# Patient Record
Sex: Male | Born: 1980 | Race: Black or African American | Hispanic: No | Smoking: Current every day smoker
Health system: Southern US, Community
[De-identification: ages and names within clinical notes are randomized; demographics above are authoritative.]

## PROBLEM LIST (undated history)

## (undated) HISTORY — PX: HERNIA REPAIR: SHX51

---

## 2011-07-21 ENCOUNTER — Emergency Department: Payer: Self-pay | Admitting: Emergency Medicine

## 2011-09-07 ENCOUNTER — Emergency Department: Payer: Self-pay | Admitting: *Deleted

## 2020-05-30 ENCOUNTER — Encounter: Payer: Self-pay | Admitting: Emergency Medicine

## 2020-05-30 ENCOUNTER — Other Ambulatory Visit: Payer: Self-pay

## 2020-05-30 ENCOUNTER — Emergency Department
Admission: EM | Admit: 2020-05-30 | Discharge: 2020-05-31 | Disposition: A | Discharge status: Other Acute Inpatient Hospital | Payer: Self-pay | Attending: Student in an Organized Health Care Education/Training Program | Admitting: Student in an Organized Health Care Education/Training Program

## 2020-05-30 DIAGNOSIS — R109 Unspecified abdominal pain: Secondary | ICD-10-CM | POA: Insufficient documentation

## 2020-05-30 DIAGNOSIS — R519 Headache, unspecified: Secondary | ICD-10-CM | POA: Insufficient documentation

## 2020-05-30 DIAGNOSIS — F1590 Other stimulant use, unspecified, uncomplicated: Secondary | ICD-10-CM | POA: Insufficient documentation

## 2020-05-30 DIAGNOSIS — R45851 Suicidal ideations: Secondary | ICD-10-CM | POA: Insufficient documentation

## 2020-05-30 DIAGNOSIS — F17200 Nicotine dependence, unspecified, uncomplicated: Secondary | ICD-10-CM | POA: Insufficient documentation

## 2020-05-30 DIAGNOSIS — F4321 Adjustment disorder with depressed mood: Secondary | ICD-10-CM | POA: Insufficient documentation

## 2020-05-30 DIAGNOSIS — Z20822 Contact with and (suspected) exposure to covid-19: Secondary | ICD-10-CM | POA: Insufficient documentation

## 2020-05-30 LAB — URINALYSIS, COMPLETE (UACMP) WITH MICROSCOPIC
Bacteria, UA: NONE SEEN
Bilirubin Urine: NEGATIVE
Glucose, UA: NEGATIVE mg/dL
Hgb urine dipstick: NEGATIVE
Ketones, ur: 80 mg/dL — AB
Leukocytes,Ua: NEGATIVE
Nitrite: NEGATIVE
Protein, ur: 30 mg/dL — AB
Specific Gravity, Urine: 1.028 (ref 1.005–1.030)
Squamous Epithelial / HPF: NONE SEEN (ref 0–5)
pH: 5 (ref 5.0–8.0)

## 2020-05-30 LAB — COMPREHENSIVE METABOLIC PANEL
ALT: 17 U/L (ref 0–44)
AST: 19 U/L (ref 15–41)
Albumin: 4.6 g/dL (ref 3.5–5.0)
Alkaline Phosphatase: 50 U/L (ref 38–126)
Anion gap: 12 (ref 5–15)
BUN: 20 mg/dL (ref 6–20)
CO2: 25 mmol/L (ref 22–32)
Calcium: 9.1 mg/dL (ref 8.9–10.3)
Chloride: 100 mmol/L (ref 98–111)
Creatinine, Ser: 1.1 mg/dL (ref 0.61–1.24)
GFR calc Af Amer: >60 mL/min (ref >60)
GFR calc non Af Amer: >60 mL/min (ref >60)
Glucose, Bld: 81 mg/dL (ref 70–99)
Potassium: 4 mmol/L (ref 3.5–5.1)
Sodium: 137 mmol/L (ref 135–145)
Total Bilirubin: 2.7 mg/dL — ABNORMAL HIGH (ref 0.3–1.2)
Total Protein: 7.5 g/dL (ref 6.5–8.1)

## 2020-05-30 LAB — URINE DRUG SCREEN, QUALITATIVE (ARMC ONLY)
Amphetamines, Ur Screen: NOT DETECTED
Barbiturates, Ur Screen: NOT DETECTED
Benzodiazepine, Ur Scrn: NOT DETECTED
Cannabinoid 50 Ng, Ur ~~LOC~~: POSITIVE — AB
Cocaine Metabolite,Ur ~~LOC~~: POSITIVE — AB
MDMA (Ecstasy)Ur Screen: NOT DETECTED
Methadone Scn, Ur: NOT DETECTED
Opiate, Ur Screen: NOT DETECTED
Phencyclidine (PCP) Ur S: NOT DETECTED
Tricyclic, Ur Screen: NOT DETECTED

## 2020-05-30 LAB — CBC
HCT: 44.5 % (ref 39.0–52.0)
Hemoglobin: 15.4 g/dL (ref 13.0–17.0)
MCH: 32.8 pg (ref 26.0–34.0)
MCHC: 34.6 g/dL (ref 30.0–36.0)
MCV: 94.9 fL (ref 80.0–100.0)
Platelets: 293 10*3/uL (ref 150–400)
RBC: 4.69 MIL/uL (ref 4.22–5.81)
RDW: 14.6 % (ref 11.5–15.5)
WBC: 8.1 10*3/uL (ref 4.0–10.5)
nRBC: 0 % (ref 0.0–0.2)

## 2020-05-30 LAB — SALICYLATE LEVEL: Salicylate Lvl: <7 mg/dL — ABNORMAL LOW (ref 7.0–30.0)

## 2020-05-30 LAB — SARS CORONAVIRUS 2 BY RT PCR (HOSPITAL ORDER, PERFORMED IN ~~LOC~~ HOSPITAL LAB): SARS Coronavirus 2: NEGATIVE

## 2020-05-30 LAB — ACETAMINOPHEN LEVEL: Acetaminophen (Tylenol), Serum: <10 ug/mL — ABNORMAL LOW (ref 10–30)

## 2020-05-30 LAB — LIPASE, BLOOD: Lipase: 21 U/L (ref 11–51)

## 2020-05-30 LAB — ETHANOL: Alcohol, Ethyl (B): <10 mg/dL (ref <10)

## 2020-05-30 MED ORDER — ONDANSETRON 4 MG PO TBDP
4.0000 mg | ORAL_TABLET | Freq: Once | ORAL | Status: AC
  Administered 2020-05-30: 4 mg via ORAL
  Filled 2020-05-30: qty 1

## 2020-05-30 MED ORDER — DICYCLOMINE HCL 10 MG PO CAPS
20.0000 mg | ORAL_CAPSULE | Freq: Once | ORAL | Status: AC
  Administered 2020-05-30: 20 mg via ORAL
  Filled 2020-05-30: qty 2

## 2020-05-30 NOTE — ED Notes (Signed)
Patient given snack.  

## 2020-05-30 NOTE — ED Notes (Signed)
White tshirt, black boxer, blue jeans, grey socks, yellow slides, cell phone placed in one bag and labeled by this RN and Mel, EDT.

## 2020-05-30 NOTE — BH Assessment (Signed)
Assessment Note  Steven Khan is an 39 y.o. male. Per triage note: Patient reports having intermittent suicidal thoughts for the last month. States for the last 2 weeks, he has started having urges to act on these thoughts. Reports he wants to do something instant, such as step in front of a car or overdose on pills. Patient states he attempted suicide once before in prison but unable to tell how long ago that was. Patient reports alcohol use as well as cocaine, ecstasy and marijuana use. Patient states his last drink was 2 days ago.   Pt presented to the ED with complaints of SI. The patient has been placed under IVC by the attending ED doctor.  Patient was resting upon this writer's arrival. Patient was irritable and presented with a labile mood. The patient was argumentative and complained about having to answer repetitive questions. The patient reported that he has no family support system. The patient explained that he's had no previous psychiatric hospitalizations or attempts to seek help. The patient reported that he was released from prison in August of 2019 and has been unable to adjust to living outside of prison. The patient reported worsening symptoms of depression such as feeling hopeless, shame, worthless, and irritability. The patient reported that he is unable to maintain employment and feels like being dead would be easier. The patient reported that he tried to overdose by taking multiple street drugs however he was unsuccessful. The patient reported that he uses cocaine, marijuana, ecstasy, and alcohol. The patient reports that he has a plan to blow his brains out since overdosing has been unsuccessful.  The patient listed having no support system and unstable housing as his main stressors. The patient reported that he was given psych meds in prison but often cheeked them because he hated the way it made him feel. Patient reported that he has poor sleep. Patient explained that his appetite  is poor and that he hasn't eaten in three days. Denies any hallucinations, homicidal ideations, or paranoia.   Diagnosis: Adjustment disorder with depressed mood  Past Medical History: History reviewed. No pertinent past medical history.  Past Surgical History:  Procedure Laterality Date  . HERNIA REPAIR      Family History: No family history on file.  Social History:  reports that he has been smoking. He has never used smokeless tobacco. He reports current alcohol use. He reports current drug use. Drugs: Cocaine and Marijuana.  Additional Social History:  Alcohol / Drug Use Pain Medications: See PTA Prescriptions: See PTA History of alcohol / drug use?: Yes Negative Consequences of Use: Financial, Personal relationships Substance #1 Name of Substance 1: Cocaine Substance #2 Name of Substance 2: Marijuana Substance #3 Name of Substance 3: Alcohol  CIWA: CIWA-Ar BP: 127/78 Pulse Rate: (!) 57 COWS:    Allergies: No Known Allergies  Home Medications: (Not in a hospital admission)   OB/GYN Status:  No LMP for male patient.  General Assessment Data Location of Assessment: [facility] ED TTS Assessment: In system Is this a Tele or Face-to-Face Assessment?: Face-to-Face Is this an Initial Assessment or a Re-assessment for this encounter?: Initial Assessment Patient Accompanied by:: N/A Language Other than English: No Living Arrangements: Homeless/Shelter What gender do you identify as?: Male Date Telepsych consult ordered in [facility]: 05/30/20 Time Telepsych consult ordered in [facility]: 1513 Marital status: Single Maiden name: n/a Pregnancy Status: No Living Arrangements: Other (Comment) Can pt return to current living arrangement?: No Admission Status: Involuntary Petitioner: ED Attending  Is patient capable of signing voluntary admission?: Yes Referral Source: Self/Family/Friend Insurance type: None  Medical Screening Exam [facility] Walk-in ONLY) Medical Exam completed:  Yes  Crisis Care Plan Living Arrangements: Other (Comment) Legal Guardian: Other: (Self) Name of Psychiatrist: None Noted Name of Therapist: None Noted  Education Status Is patient currently in school?: No Is the patient employed, unemployed or receiving disability?: Unemployed  Risk to self with the past 6 months Suicidal Ideation: Yes-Currently Present Has patient been a risk to self within the past 6 months prior to admission? : No Suicidal Intent: Yes-Currently Present Has patient had any suicidal intent within the past 6 months prior to admission? : Yes Is patient at risk for suicide?: Yes Suicidal Plan?: Yes-Currently Present Has patient had any suicidal plan within the past 6 months prior to admission? : Yes Specify Current Suicidal Plan: Patient's plan was to overdose on drugs/alcohol Access to Means: No What has been your use of drugs/alcohol within the last 12 months?: Polysubtance; marijuana, alcohol, cocaine How many times?: 1 Triggers for Past Attempts: Unpredictable Intentional Self Injurious Behavior: None Family Suicide History: Unknown Recent stressful life event(s): Conflict (Comment) Persecutory voices/beliefs?: No Depression: Yes Depression Symptoms: Feeling worthless/self pity, Feeling angry/irritable Substance abuse history and/or treatment for substance abuse?: Yes Suicide prevention information given to non-admitted patients: Not applicable  Risk to Others within the past 6 months Homicidal Ideation: No Does patient have any lifetime risk of violence toward others beyond the six months prior to admission? : No Thoughts of Harm to Others: No Current Homicidal Intent: No Current Homicidal Plan: No Access to Homicidal Means: No Identified Victim: n/a History of harm to others?: No Assessment of Violence: None Noted Violent Behavior Description: n/a Does patient have access to weapons?: No Criminal Charges Pending?: No Does patient have a court date:  No Is patient on probation?: No  Psychosis Hallucinations: None noted Delusions: None noted  Mental Status Report Appearance/Hygiene: In scrubs Eye Contact: Good Motor Activity: Freedom of movement Speech: Argumentative Level of Consciousness: Irritable Mood: Labile, Despair, Worthless, low self-esteem, Ashamed/humiliated Affect: Irritable, Labile Anxiety Level: Minimal Thought Processes: Coherent, Relevant Judgement: Impaired Orientation: Person, Place, Time, Situation Obsessive Compulsive Thoughts/Behaviors: None  Cognitive Functioning Concentration: Normal Memory: Recent Intact, Remote Intact Is patient IDD: No Insight: Poor Impulse Control: Poor Appetite: Poor Have you had any weight changes? : No Change Sleep: Decreased Total Hours of Sleep:  (Unable to Assess) Vegetative Symptoms: None  ADLScreening [facility] Assessment Services) Patient's cognitive ability adequate to safely complete daily activities?: Yes Patient able to express need for assistance with ADLs?: Yes Independently performs ADLs?: Yes (appropriate for developmental age)  Prior Inpatient Therapy Prior Inpatient Therapy: No  Prior Outpatient Therapy Prior Outpatient Therapy: No Does patient have an ACCT team?: No Does patient have Intensive In-House Services?  : No Does patient have [facility] services? : No Does patient have P4CC services?: No  ADL Screening (condition at time of admission) Patient's cognitive ability adequate to safely complete daily activities?: Yes Is the patient deaf or have difficulty hearing?: No Does the patient have difficulty seeing, even when wearing glasses/contacts?: No Does the patient have difficulty concentrating, remembering, or making decisions?: No Patient able to express need for assistance with ADLs?: Yes Does the patient have difficulty dressing or bathing?: No Independently performs ADLs?: Yes (appropriate for developmental age) Does the patient have difficulty  walking or climbing stairs?: No Weakness of Legs: None Weakness of Arms/Hands: None  Home Assistive Devices/Equipment Home Assistive Devices/Equipment:  None  Therapy Consults (therapy consults require a physician order) PT Evaluation Needed: No OT Evalulation Needed: No SLP Evaluation Needed: No   Values / Beliefs Cultural Requests During Hospitalization: None Spiritual Requests During Hospitalization: None Consults Spiritual Care Consult Needed: No Transition of Care Team Consult Needed: No Advance Directives (For Healthcare) Does Patient Have a Medical Advance Directive?: No          Disposition: Per Annice Pih, NP patient to be observed overnight and reassessed in the A.M.  Disposition Initial Assessment Completed for this Encounter: Yes  On Site Evaluation by:   Reviewed with Physician:    Foy Guadalajara 05/30/2020 11:30 PM

## 2020-05-30 NOTE — ED Provider Notes (Signed)
[facility] Emergency Department Provider Note    First MD Initiated Contact with Patient 05/30/20 1402     (approximate)  I have reviewed the triage vital signs and the nursing notes.   HISTORY  Chief Complaint Suicidal and Abdominal Pain    HPI Steven Khan is a 39 y.o. male with history of suicide attempts presents to the ER with suicidal ideation stating he plans to overdose on pills drown himself or stepdown from traffic.  States he has been admitted for psychiatric facility when he was in prison.  Does not describe any inciting event to make him feel more depressed.  Is not currently on any medication.    History reviewed. No pertinent past medical history. No family history on file. Past Surgical History:  Procedure Laterality Date   HERNIA REPAIR     There are no problems to display for this patient.     Prior to Admission medications   Not on File    Allergies Patient has no known allergies.    Social History Social History   Tobacco Use   Smoking status: Current Every Day Smoker   Smokeless tobacco: Never Used  Substance Use Topics   Alcohol use: Yes   Drug use: Yes    Types: Cocaine, Marijuana    Comment: ecstasy    Review of Systems Patient denies headaches, rhinorrhea, blurry vision, numbness, shortness of breath, chest pain, edema, cough, abdominal pain, nausea, vomiting, diarrhea, dysuria, fevers, rashes or hallucinations unless otherwise stated above in HPI. ____________________________________________   PHYSICAL EXAM:  VITAL SIGNS: Vitals:   05/30/20 1201  BP: (!) 110/57  Pulse: 75  Resp: 16  Temp: 98.1 F (36.7 C)  SpO2: 97%    Constitutional: Alert and oriented.  Eyes: Conjunctivae are normal.  Head: Atraumatic. Nose: No congestion/rhinnorhea. Mouth/Throat: Mucous membranes are moist.   Neck: No stridor. Painless ROM.  Cardiovascular: Normal rate, regular rhythm. Grossly normal heart  sounds.  Good peripheral circulation. Respiratory: Normal respiratory effort.  No retractions. Lungs CTAB. Gastrointestinal: Soft and nontender. No distention. No abdominal bruits. No CVA tenderness. Genitourinary:  Musculoskeletal: No lower extremity tenderness nor edema.  No joint effusions. Neurologic:  Normal speech and language. No gross focal neurologic deficits are appreciated. No facial droop Skin:  Skin is warm, dry and intact. No rash noted. Psychiatric: Mood and affect are normal. Speech and behavior are normal.  ____________________________________________   LABS (all labs ordered are listed, but only abnormal results are displayed)  Results for orders placed or performed during the hospital encounter of 05/30/20 (from the past 24 hour(s))  Comprehensive metabolic panel     Status: Abnormal   Collection Time: 05/30/20 12:08 PM  Result Value Ref Range   Sodium 137 135 - 145 mmol/L   Potassium 4.0 3.5 - 5.1 mmol/L   Chloride 100 98 - 111 mmol/L   CO2 25 22 - 32 mmol/L   Glucose, Bld 81 70 - 99 mg/dL   BUN 20 6 - 20 mg/dL   Creatinine, Ser 5.64 0.61 - 1.24 mg/dL   Calcium 9.1 8.9 - 33.2 mg/dL   Total Protein 7.5 6.5 - 8.1 g/dL   Albumin 4.6 3.5 - 5.0 g/dL   AST 19 15 - 41 U/L   ALT 17 0 - 44 U/L   Alkaline Phosphatase 50 38 - 126 U/L   Total Bilirubin 2.7 (H) 0.3 - 1.2 mg/dL   GFR calc non Af Amer >60 >60 mL/min  GFR calc Af Amer >60 >60 mL/min   Anion gap 12 5 - 15  Ethanol     Status: None   Collection Time: 05/30/20 12:08 PM  Result Value Ref Range   Alcohol, Ethyl (B) <10 <10 mg/dL  Salicylate level     Status: Abnormal   Collection Time: 05/30/20 12:08 PM  Result Value Ref Range   Salicylate Lvl <7.0 (L) 7.0 - 30.0 mg/dL  Acetaminophen level     Status: Abnormal   Collection Time: 05/30/20 12:08 PM  Result Value Ref Range   Acetaminophen (Tylenol), Serum <10 (L) 10 - 30 ug/mL  cbc     Status: None   Collection Time: 05/30/20 12:08 PM  Result Value Ref  Range   WBC 8.1 4.0 - 10.5 K/uL   RBC 4.69 4.22 - 5.81 MIL/uL   Hemoglobin 15.4 13.0 - 17.0 g/dL   HCT 07.6 39 - 52 %   MCV 94.9 80.0 - 100.0 fL   MCH 32.8 26.0 - 34.0 pg   MCHC 34.6 30.0 - 36.0 g/dL   RDW 22.6 33.3 - 54.5 %   Platelets 293 150 - 400 K/uL   nRBC 0.0 0.0 - 0.2 %  Lipase, blood     Status: None   Collection Time: 05/30/20 12:10 PM  Result Value Ref Range   Lipase 21 11 - 51 U/L   ____________________________________________  ____________________________________________  RADIOLOGY  I personally reviewed all radiographic images ordered to evaluate for the above acute complaints and reviewed radiology reports and findings.  These findings were personally discussed with the patient.  Please see medical record for radiology report.  ____________________________________________   PROCEDURES  Procedure(s) performed:  Procedures    Critical Care performed: no ____________________________________________   INITIAL IMPRESSION / ASSESSMENT AND PLAN / ED COURSE  Pertinent labs & imaging results that were available during my care of the patient were reviewed by me and considered in my medical decision making (see chart for details).   DDX: Psychosis, delirium, medication effect, noncompliance, polysubstance abuse, Si, Hi, depression   Steven Khan is a 39 y.o. who presents to the ED with for evaluation of si.  Patient has psych history of suicidal ideation.  Laboratory testing was ordered to evaluation for underlying electrolyte derangement or signs of underlying organic pathology to explain today's presentation.  Based on history and physical and laboratory evaluation, it appears that the patient's presentation is 2/2 underlying psychiatric disorder and will require further evaluation and management by inpatient psychiatry.  Patient was  made an IVC due to si with plans.  Disposition pending psychiatric evaluation.  The patient has been placed in psychiatric  observation due to the need to provide a safe environment for the patient while obtaining psychiatric consultation and evaluation, as well as ongoing medical and medication management to treat the patient's condition.  The patient has been placed under full IVC at this time.     The patient was evaluated in Emergency Department today for the symptoms described in the history of present illness. He/she was evaluated in the context of the global COVID-19 pandemic, which necessitated consideration that the patient might be at risk for infection with the SARS-CoV-2 virus that causes COVID-19. Institutional protocols and algorithms that pertain to the evaluation of patients at risk for COVID-19 are in a state of rapid change based on information released by regulatory bodies including the CDC and federal and state organizations. These policies and algorithms were followed during the patient's care  in the ED.  As part of my medical decision making, I reviewed the following data within the electronic MEDICAL RECORD NUMBER Nursing notes reviewed and incorporated, Labs reviewed, notes from prior ED visits and [place] Controlled Substance Database   ____________________________________________   FINAL CLINICAL IMPRESSION(S) / ED DIAGNOSES  Final diagnoses:  Suicidal ideation      NEW MEDICATIONS STARTED DURING THIS VISIT:  New Prescriptions   No medications on file     Note:  This document was prepared using Dragon voice recognition software and may include unintentional dictation errors.    Willy Eddy, MD 05/30/20 [phone]

## 2020-05-30 NOTE — ED Notes (Signed)
Psych speaking to pt at this time

## 2020-05-30 NOTE — ED Notes (Signed)
INVOLUNTARY with all papers on chart, awaiting TTS/PSYCH consult °

## 2020-05-30 NOTE — ED Notes (Signed)
Pt requests for medication for abdominal pain, this nurse reached out to MD and awaiting response

## 2020-05-30 NOTE — ED Triage Notes (Signed)
Patient reports having intermittent suicidal thoughts for the last month. States for the last 2 weeks, he has started having urges to act on these thoughts. Reports he wants to do something instant, such as step in front of a car or overdose on pills. Patient states he attempted suicide once before in prison but unable to tell how long ago that was. Patient reports alcohol use as well as cocaine, ecstasy and marijuana use. Patient states his last drink was 2 days ago.   Patient also reports abdominal pain and cramping with decreased appetite and intermittent vomiting for the last 2 days.

## 2020-05-30 NOTE — ED Notes (Signed)
Pt to consult room at this time to speak to psych team, security with pt

## 2020-05-31 ENCOUNTER — Inpatient Hospital Stay
Admission: AD | Admit: 2020-05-31 | Discharge: 2020-06-01 | DRG: 885 | Disposition: A | Discharge status: Other Acute Inpatient Hospital | Payer: Self-pay | Source: Intra-hospital | Attending: Psychiatry | Admitting: Psychiatry

## 2020-05-31 ENCOUNTER — Other Ambulatory Visit: Payer: Self-pay

## 2020-05-31 ENCOUNTER — Encounter: Payer: Self-pay | Admitting: Internal Medicine

## 2020-05-31 DIAGNOSIS — F1010 Alcohol abuse, uncomplicated: Secondary | ICD-10-CM | POA: Diagnosis present

## 2020-05-31 DIAGNOSIS — F17290 Nicotine dependence, other tobacco product, uncomplicated: Secondary | ICD-10-CM | POA: Diagnosis present

## 2020-05-31 DIAGNOSIS — Z638 Other specified problems related to primary support group: Secondary | ICD-10-CM

## 2020-05-31 DIAGNOSIS — R45851 Suicidal ideations: Secondary | ICD-10-CM | POA: Diagnosis present

## 2020-05-31 DIAGNOSIS — Z915 Personal history of self-harm: Secondary | ICD-10-CM

## 2020-05-31 DIAGNOSIS — F4310 Post-traumatic stress disorder, unspecified: Secondary | ICD-10-CM | POA: Diagnosis present

## 2020-05-31 DIAGNOSIS — Z590 Homelessness: Secondary | ICD-10-CM

## 2020-05-31 DIAGNOSIS — G8929 Other chronic pain: Secondary | ICD-10-CM | POA: Diagnosis present

## 2020-05-31 DIAGNOSIS — F333 Major depressive disorder, recurrent, severe with psychotic symptoms: Principal | ICD-10-CM | POA: Diagnosis present

## 2020-05-31 DIAGNOSIS — F1410 Cocaine abuse, uncomplicated: Secondary | ICD-10-CM | POA: Diagnosis present

## 2020-05-31 DIAGNOSIS — R109 Unspecified abdominal pain: Secondary | ICD-10-CM | POA: Diagnosis present

## 2020-05-31 MED ORDER — CHLORDIAZEPOXIDE HCL 25 MG PO CAPS
50.0000 mg | ORAL_CAPSULE | Freq: Once | ORAL | Status: AC
  Administered 2020-05-31: 50 mg via ORAL
  Filled 2020-05-31: qty 2

## 2020-05-31 MED ORDER — DICYCLOMINE HCL 10 MG PO CAPS
10.0000 mg | ORAL_CAPSULE | Freq: Once | ORAL | Status: DC
  Filled 2020-05-31: qty 1

## 2020-05-31 MED ORDER — ACETAMINOPHEN 325 MG PO TABS: 650.0000 mg | ORAL_TABLET | Freq: Four times a day (QID) | ORAL | Status: DC | PRN

## 2020-05-31 MED ORDER — TRAZODONE HCL 50 MG PO TABS
50.0000 mg | ORAL_TABLET | Freq: Every day | ORAL | Status: DC
  Administered 2020-05-31: 50 mg via ORAL
  Filled 2020-05-31: qty 1

## 2020-05-31 MED ORDER — ALUM & MAG HYDROXIDE-SIMETH 200-200-20 MG/5ML PO SUSP: 30.0000 mL | ORAL | Status: DC | PRN

## 2020-05-31 MED ORDER — PANTOPRAZOLE SODIUM 40 MG PO TBEC
40.0000 mg | DELAYED_RELEASE_TABLET | Freq: Once | ORAL | Status: AC
  Administered 2020-05-31: 40 mg via ORAL
  Filled 2020-05-31: qty 1

## 2020-05-31 MED ORDER — MAGNESIUM HYDROXIDE 400 MG/5ML PO SUSP
30.0000 mL | Freq: Every day | ORAL | Status: DC | PRN
  Administered 2020-05-31 – 2020-06-01 (×2): 30 mL via ORAL
  Filled 2020-05-31 (×2): qty 30

## 2020-05-31 MED ORDER — HYDROXYZINE HCL 10 MG PO TABS: 10.0000 mg | ORAL_TABLET | Freq: Four times a day (QID) | ORAL | Status: DC | PRN

## 2020-05-31 MED ORDER — FAMOTIDINE 20 MG PO TABS
10.0000 mg | ORAL_TABLET | Freq: Once | ORAL | Status: AC
  Administered 2020-05-31: 10 mg via ORAL
  Filled 2020-05-31: qty 1

## 2020-05-31 NOTE — BH Assessment (Signed)
Patient is to be admitted to [facility] by Dr. Smith Robert.  Attending Physician will be Dr. Toni Amend.   Patient has been assigned to room 311, by [facility] Charge Nurse Megan.   ER staff is aware of the admission:  Nitchia, ER Secretary    Dr. Adaline Sill, ER MD   Victorino Dike, Patient's Nurse   Tho, Patient Access.

## 2020-05-31 NOTE — Plan of Care (Signed)
Patient new to the unit, hasn't had time to progress  Problem: Education: Goal: Knowledge of [facility] Education information/materials will improve Outcome: Not Progressing Goal: Emotional status will improve Outcome: Not Progressing Goal: Mental status will improve Outcome: Not Progressing Goal: Verbalization of understanding the information provided will improve Outcome: Not Progressing   Problem: Coping: Goal: Ability to verbalize frustrations and anger appropriately will improve Outcome: Not Progressing Goal: Ability to demonstrate self-control will improve Outcome: Not Progressing   Problem: Physical Regulation: Goal: Ability to maintain clinical measurements within normal limits will improve Outcome: Not Progressing   Problem: Safety: Goal: Periods of time without injury will increase Outcome: Not Progressing   Problem: Education: Goal: Utilization of techniques to improve thought processes will improve Outcome: Not Progressing Goal: Knowledge of the prescribed therapeutic regimen will improve Outcome: Not Progressing   Problem: Activity: Goal: Interest or engagement in leisure activities will improve Outcome: Not Progressing

## 2020-05-31 NOTE — Plan of Care (Signed)
Newly admitted and adjusting to the unit. Cooperative and  calm. Continues to report hearing voices that tell him to harm himself. Contracts for safety.

## 2020-05-31 NOTE — BH Assessment (Addendum)
Writer spoke with the patient to complete an updated/reassessment. Patient continues to report SI and A/H. He denies V/H and HI.

## 2020-05-31 NOTE — ED Notes (Signed)
Pt lunch tray set at bedside. 

## 2020-05-31 NOTE — Tx Team (Signed)
Initial Treatment Plan 05/31/2020 7:05 PM Steven Khan [id number]:[id number]    PATIENT STRESSORS: Financial difficulties Health problems Legal issue Occupational concerns Substance abuse   PATIENT STRENGTHS: Ability for insight Communication skills   PATIENT IDENTIFIED PROBLEMS: Depression  Hallucinations  Suicidal thoughts                 DISCHARGE CRITERIA:  Adequate post-discharge living arrangements Medical problems require only outpatient monitoring Motivation to continue treatment in a less acute level of care Verbal commitment to aftercare and medication compliance  PRELIMINARY DISCHARGE PLAN: Attend 12-step recovery group Outpatient therapy Placement in alternative living arrangements  PATIENT/FAMILY INVOLVEMENT: This treatment plan has been presented to and reviewed with the patient, Steven Khan.  The patient has been given the opportunity to ask questions and make suggestions.  Olin Pia, RN 05/31/2020, 7:05 PM

## 2020-05-31 NOTE — ED Notes (Signed)
Pt throwing up in restroom.

## 2020-05-31 NOTE — Progress Notes (Signed)
Patient ID: Steven Khan, male   DOB: 14-Feb-1981, 39 y.o.   MRN: [id number] Patient presents involuntarily secondary to increased depression, hallucinations and suicidal ideations. Reports that he called a help line as he was feeling more and more overwhelmed and unable to control his negative thoughts. Patient reports multiple stressors: he was just kicked out of the house by a girlfriend in [place] and decided to come back to [place] his hometown. Patient reports that he has not been doing so well after 7 years in prison. Was released in 2019 and has not been able to work a paying job. He is currently homeless and has been abusing crack, cocaine, THC, etc. He has also been abusing alcohol. He is a daily smoker of 4 to 5 cigarettes. Reports that family is not supportive "they don't have anything to do with me, I guess". Patient reports that he has 2 sons from 2 different mothers. He is frustrated by not being able to provide for his children. Patient reports that he is currently hearing voices telling him to harm himself " but I won't do it". Skin assessment performed by both Clinical research associate and Aundra Millet, RN. Patient presents with multiple tattoos allover the skin. No additional skin issues. Patient complained of stomach issues and vomited once when he attempted  to eat dinner. MD was notified and medication order given: Oncoming RN to follow up. Patient was oriented to the unit and safety precautions initiated. To be evaluated by attending MD in AM.

## 2020-06-01 ENCOUNTER — Other Ambulatory Visit: Payer: Self-pay

## 2020-06-01 ENCOUNTER — Inpatient Hospital Stay
Admission: AD | Admit: 2020-06-01 | Discharge: 2020-06-04 | DRG: 392 | Disposition: A | Discharge status: Other Acute Inpatient Hospital | Payer: Self-pay | Source: Intra-hospital | Attending: Internal Medicine | Admitting: Internal Medicine

## 2020-06-01 ENCOUNTER — Inpatient Hospital Stay: Payer: Self-pay

## 2020-06-01 DIAGNOSIS — Z590 Homelessness: Secondary | ICD-10-CM

## 2020-06-01 DIAGNOSIS — R45851 Suicidal ideations: Secondary | ICD-10-CM | POA: Diagnosis present

## 2020-06-01 DIAGNOSIS — F1210 Cannabis abuse, uncomplicated: Secondary | ICD-10-CM | POA: Diagnosis present

## 2020-06-01 DIAGNOSIS — F333 Major depressive disorder, recurrent, severe with psychotic symptoms: Principal | ICD-10-CM

## 2020-06-01 DIAGNOSIS — R109 Unspecified abdominal pain: Secondary | ICD-10-CM | POA: Diagnosis present

## 2020-06-01 DIAGNOSIS — K219 Gastro-esophageal reflux disease without esophagitis: Secondary | ICD-10-CM | POA: Diagnosis present

## 2020-06-01 DIAGNOSIS — F1010 Alcohol abuse, uncomplicated: Secondary | ICD-10-CM

## 2020-06-01 DIAGNOSIS — Z813 Family history of other psychoactive substance abuse and dependence: Secondary | ICD-10-CM

## 2020-06-01 DIAGNOSIS — F4310 Post-traumatic stress disorder, unspecified: Secondary | ICD-10-CM | POA: Diagnosis present

## 2020-06-01 DIAGNOSIS — F4320 Adjustment disorder, unspecified: Secondary | ICD-10-CM | POA: Diagnosis present

## 2020-06-01 DIAGNOSIS — Z915 Personal history of self-harm: Secondary | ICD-10-CM

## 2020-06-01 DIAGNOSIS — F1410 Cocaine abuse, uncomplicated: Secondary | ICD-10-CM | POA: Diagnosis present

## 2020-06-01 DIAGNOSIS — R1110 Vomiting, unspecified: Secondary | ICD-10-CM

## 2020-06-01 DIAGNOSIS — Z818 Family history of other mental and behavioral disorders: Secondary | ICD-10-CM

## 2020-06-01 DIAGNOSIS — Z046 Encounter for general psychiatric examination, requested by authority: Secondary | ICD-10-CM

## 2020-06-01 DIAGNOSIS — F17200 Nicotine dependence, unspecified, uncomplicated: Secondary | ICD-10-CM | POA: Diagnosis present

## 2020-06-01 DIAGNOSIS — K2900 Acute gastritis without bleeding: Principal | ICD-10-CM | POA: Diagnosis present

## 2020-06-01 LAB — HEPATIC FUNCTION PANEL
ALT: 13 U/L (ref 0–44)
AST: 16 U/L (ref 15–41)
Albumin: 4.2 g/dL (ref 3.5–5.0)
Alkaline Phosphatase: 45 U/L (ref 38–126)
Bilirubin, Direct: 0.2 mg/dL (ref 0.0–0.2)
Indirect Bilirubin: 1.8 mg/dL — ABNORMAL HIGH (ref 0.3–0.9)
Total Bilirubin: 2 mg/dL — ABNORMAL HIGH (ref 0.3–1.2)
Total Protein: 7 g/dL (ref 6.5–8.1)

## 2020-06-01 LAB — LIPID PANEL
Cholesterol: 206 mg/dL — ABNORMAL HIGH (ref 0–200)
HDL: 57 mg/dL (ref >40)
LDL Cholesterol: 135 mg/dL — ABNORMAL HIGH (ref 0–99)
Total CHOL/HDL Ratio: 3.6 RATIO
Triglycerides: 70 mg/dL (ref <150)
VLDL: 14 mg/dL (ref 0–40)

## 2020-06-01 LAB — TSH: TSH: 1.335 u[IU]/mL (ref 0.350–4.500)

## 2020-06-01 MED ORDER — ACETAMINOPHEN 325 MG PO TABS: 650.0000 mg | ORAL_TABLET | Freq: Four times a day (QID) | ORAL | Status: DC | PRN

## 2020-06-01 MED ORDER — SENNOSIDES-DOCUSATE SODIUM 8.6-50 MG PO TABS: 1.0000 | ORAL_TABLET | Freq: Every evening | ORAL | Status: DC | PRN

## 2020-06-01 MED ORDER — PANTOPRAZOLE SODIUM 40 MG PO TBEC
40.0000 mg | DELAYED_RELEASE_TABLET | Freq: Two times a day (BID) | ORAL | Status: DC
  Administered 2020-06-01: 40 mg via ORAL
  Filled 2020-06-01: qty 1

## 2020-06-01 MED ORDER — ONDANSETRON HCL 4 MG/2ML IJ SOLN
4.0000 mg | Freq: Four times a day (QID) | INTRAMUSCULAR | Status: DC | PRN
  Administered 2020-06-02: 4 mg via INTRAVENOUS
  Filled 2020-06-01: qty 2

## 2020-06-01 MED ORDER — MIRTAZAPINE 15 MG PO TABS: 15.0000 mg | ORAL_TABLET | Freq: Every day | ORAL | Status: DC

## 2020-06-01 MED ORDER — ACETAMINOPHEN 650 MG RE SUPP: 650.0000 mg | Freq: Four times a day (QID) | RECTAL | Status: DC | PRN

## 2020-06-01 MED ORDER — SENNOSIDES-DOCUSATE SODIUM 8.6-50 MG PO TABS
2.0000 | ORAL_TABLET | Freq: Every day | ORAL | Status: DC
  Filled 2020-06-01: qty 2

## 2020-06-01 MED ORDER — CHLORDIAZEPOXIDE HCL 25 MG PO CAPS
25.0000 mg | ORAL_CAPSULE | Freq: Three times a day (TID) | ORAL | Status: DC
  Administered 2020-06-01: 25 mg via ORAL
  Filled 2020-06-01: qty 1

## 2020-06-01 MED ORDER — SODIUM CHLORIDE 0.9 % IV SOLN: INTRAVENOUS | Status: DC

## 2020-06-01 MED ORDER — ONDANSETRON 4 MG PO TBDP: 4.0000 mg | ORAL_TABLET | Freq: Three times a day (TID) | ORAL | Status: DC | PRN

## 2020-06-01 MED ORDER — ENOXAPARIN SODIUM 40 MG/0.4ML ~~LOC~~ SOLN
40.0000 mg | SUBCUTANEOUS | Status: DC
  Administered 2020-06-01 – 2020-06-03 (×3): 40 mg via SUBCUTANEOUS
  Filled 2020-06-01 (×3): qty 0.4

## 2020-06-01 MED ORDER — SENNOSIDES-DOCUSATE SODIUM 8.6-50 MG PO TABS
2.0000 | ORAL_TABLET | ORAL | Status: DC
  Filled 2020-06-01: qty 2

## 2020-06-01 MED ORDER — ONDANSETRON HCL 4 MG PO TABS: 4.0000 mg | ORAL_TABLET | Freq: Four times a day (QID) | ORAL | Status: DC | PRN

## 2020-06-01 MED ORDER — HYDROCODONE-ACETAMINOPHEN 5-325 MG PO TABS
1.0000 | ORAL_TABLET | ORAL | Status: DC | PRN
  Administered 2020-06-02 – 2020-06-04 (×6): 2 via ORAL
  Filled 2020-06-01 (×6): qty 2

## 2020-06-01 MED ORDER — ONDANSETRON 4 MG PO TBDP
4.0000 mg | ORAL_TABLET | ORAL | Status: AC
  Administered 2020-06-01: 4 mg via ORAL
  Filled 2020-06-01: qty 1

## 2020-06-01 MED ORDER — MORPHINE SULFATE (PF) 2 MG/ML IV SOLN
2.0000 mg | INTRAVENOUS | Status: DC | PRN
  Administered 2020-06-02 – 2020-06-04 (×8): 2 mg via INTRAVENOUS
  Filled 2020-06-01 (×9): qty 1

## 2020-06-01 NOTE — [facility] Suicide Risk Assessment (Signed)
[facility] Admission Suicide Risk Assessment   Nursing information obtained from:  Patient Demographic factors:  Male, Divorced or widowed, Unemployed, Low socioeconomic status Current Mental Status:  Suicidal ideation indicated by patient Loss Factors:  Legal issues, Financial problems / change in socioeconomic status, Loss of significant relationship Historical Factors:  Prior suicide attempts Risk Reduction Factors:  NA  Total Time spent with patient: 1 hour Principal Problem: Depression, major, recurrent, severe with psychosis (HCC) Diagnosis:  Principal Problem:   Depression, major, recurrent, severe with psychosis (HCC) Active Problems:   PTSD (post-traumatic stress disorder)   Alcohol abuse   Cocaine abuse (HCC)   Abdominal pain  Subjective Data: Patient seen and chart reviewed.  39 year old man presented to the emergency room seeking help for depressed mood suicidal thoughts agitation confusion and anxiety hopelessness.  Patient continues to have multiple symptoms of depression.  Denies intention to harm himself in the hospital and so far has been cooperative and lucid without any psychotic symptoms or acute behavior problems.  Continued Clinical Symptoms:  Alcohol Use Disorder Identification Test Final Score (AUDIT): 29 The "Alcohol Use Disorders Identification Test", Guidelines for Use in Primary Care, Second Edition.  World Science writer [facility]). Score between 0-7:  no or low risk or alcohol related problems. Score between 8-15:  moderate risk of alcohol related problems. Score between 16-19:  high risk of alcohol related problems. Score 20 or above:  warrants further diagnostic evaluation for alcohol dependence and treatment.   CLINICAL FACTORS:   Depression:   Comorbid alcohol abuse/dependence Alcohol/Substance Abuse/Dependencies   Musculoskeletal: Strength & Muscle Tone: within normal limits Gait & Station: normal Patient leans: N/A  Psychiatric Specialty  Exam: Physical Exam Vitals and nursing note reviewed.  Constitutional:      Appearance: He is well-developed.  HENT:     Head: Normocephalic and atraumatic.  Eyes:     Conjunctiva/sclera: Conjunctivae normal.     Pupils: Pupils are equal, round, and reactive to light.  Cardiovascular:     Heart sounds: Normal heart sounds.  Pulmonary:     Effort: Pulmonary effort is normal.  Abdominal:     Palpations: Abdomen is soft.  Musculoskeletal:        General: Normal range of motion.     Cervical back: Normal range of motion.  Skin:    General: Skin is warm and dry.  Neurological:     General: No focal deficit present.     Mental Status: He is alert.  Psychiatric:        Attention and Perception: Attention normal.        Mood and Affect: Mood is anxious and depressed.        Speech: Speech is delayed.        Behavior: Behavior is agitated. Behavior is not aggressive.        Thought Content: Thought content is not paranoid. Thought content includes suicidal ideation. Thought content does not include homicidal ideation. Thought content does not include suicidal plan.        Cognition and Memory: Cognition normal.        Judgment: Judgment normal.     Review of Systems  Constitutional: Negative.   HENT: Negative.   Eyes: Negative.   Respiratory: Negative.   Cardiovascular: Negative.   Gastrointestinal: Positive for abdominal pain, constipation and nausea.  Musculoskeletal: Negative.   Skin: Negative.   Neurological: Negative.   Psychiatric/Behavioral: Positive for agitation, behavioral problems, confusion, dysphoric mood and suicidal ideas. The patient is nervous/anxious.  Blood pressure (!) 146/94, pulse 79, temperature 98.1 F (36.7 C), temperature source Oral, resp. rate 20, height 6' (1.829 m), weight 85.3 kg, SpO2 98 %.Body mass index is 25.5 kg/m.  General Appearance: Casual  Eye Contact:  Minimal  Speech:  Slow  Volume:  Decreased  Mood:  Dysphoric  Affect:   Constricted  Thought Process:  Coherent  Orientation:  Full (Time, Place, and Person)  Thought Content:  Rumination and Tangential  Suicidal Thoughts:  Yes.  without intent/plan  Homicidal Thoughts:  No  Memory:  Immediate;   Fair Recent;   Fair Remote;   Fair  Judgement:  Fair  Insight:  Fair  Psychomotor Activity:  Decreased  Concentration:  Concentration: Fair  Recall:  [employer] of Knowledge:  Fair  Language:  Fair  Akathisia:  No  Handed:  Right  AIMS (if indicated):     Assets:  Desire for Improvement Resilience  ADL's:  Intact  Cognition:  WNL  Sleep:  Number of Hours: 6.75      COGNITIVE FEATURES THAT CONTRIBUTE TO RISK:  Thought constriction (tunnel vision)    SUICIDE RISK:   Mild:  Suicidal ideation of limited frequency, intensity, duration, and specificity.  There are no identifiable plans, no associated intent, mild dysphoria and related symptoms, good self-control (both objective and subjective assessment), few other risk factors, and identifiable protective factors, including available and accessible social support.  PLAN OF CARE: Continue 15-minute checks.  Initiate medicine for depression and anxiety.  Engage in individual and group therapy.  Treat medical problems.  Work on arrangements for appropriate discharge planning  I certify that inpatient services furnished can reasonably be expected to improve the patient's condition.   Mordecai Rasmussen, MD 06/01/2020, 1:21 PM

## 2020-06-01 NOTE — [facility] Group Notes (Signed)
[facility] Group Notes: (Clinical Social Work)   06/01/2020      Type of Therapy:  Group Therapy   Participation Level:  Did Not Attend - was invited individually by Nurse/MHT and chose not to attend.   Susa Simmonds, LCSWA 06/01/2020  2:21 PM
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
## 2020-06-01 NOTE — Progress Notes (Signed)
At shift change, patient came to the nurses station and banged on the window saying he needed to go to the emergency room because he thought he was having a heart attack. He complained of crushing chest pain 10/10, nausea and shortness of breath. He said he had been having issues with abdominal pain that no one has been able to find the cause of. He says narcotic pain medicine makes him constipated and nauseated and do not help his pain. He says he is here for suicidal thoughts related to his abdominal pain which has worsened since his admission here two days ago until he can't take it anymore. He says he has not been eating and he has gone from weighing 230 lbs to 188 lbs in one month. Rapid was called, EKG done, and NP on call for psychiatry made aware. Patient currently denies SI, HI and AVH and contracts for safety. Patient is being transferred to 2A for further evaluation

## 2020-06-01 NOTE — Progress Notes (Signed)
Patient reused dinnertime medications, choosing to take them at bedtime.

## 2020-06-01 NOTE — Progress Notes (Signed)
Patient continues to complain of stomach discomfort reporting that "I am tired of it". Per MHT's report, patient mentioned that he is ready to end his life due to this stomach pain which is not relieved by available medications.  Safety precautions reinforced.

## 2020-06-01 NOTE — Plan of Care (Signed)
Patient has remained restless and anxious. Complaining of stomach discomfort. Was complaining of constipation but had a small BM after receiving milk of magnesia and prune juice. Patient continues to complain of stomach issues. Was encouraged to discuss the issue with me during evaluation. Patient is otherwise cooperative and getting along with peers. Safety precautions maintained.

## 2020-06-01 NOTE — H&P (Signed)
History and Physical    Steven Khan GQQ:[id number] DOB: 20-Sep-1981 DOA: (Not on file)  PCP: Patient, No Pcp Per   Patient coming from: behavioral health  I have personally briefly reviewed patient's old medical records in [facility]  Chief Complaint: abdominal pain and vomiting   HPI: Steven Khan is a 39 y.o. male with medical history significant for homelessness and polysubstance abuse  admitted to behavioral health on 05/30/2020 under IVC, for suicidal attempt with plan, diagnosed with severe depression with psychosis, who developed intermittent abdominal pain associated with nausea on the a.m. of 06/01/2020, associated with constipation.  Patient's pain is in the epigastric and right upper quadrant, 10 out of 10, crampy, no aggravating or alleviating factors.  He was treated empirically with milk of mag and prune juice without improvement.  He continued to have abdominal pain as the day progressed and started vomiting.  Was unable to eat his lunch and dinner.  His symptoms suddenly worsened at around 7 PM and a rapid response was called.  EKG showed no acute ST-T wave changes.  Hospitalist consulted for admission to the medical service.  Patient at the emergency room at [facility] couple weeks prior and had a scan and discharge.  Not found in Care Everywhere.    Review of Systems: As per HPI otherwise all other systems on review of systems negative.    No past medical history . reviewed  Past Surgical History:  Procedure Laterality Date  . HERNIA REPAIR       reports that he has been smoking. He has never used smokeless tobacco. He reports current alcohol use. He reports current drug use. Drugs: Cocaine and Marijuana.  No Known Allergies  History reviewed. No pertinent family history.    Prior to Admission medications   Medication Sig Start Date End Date Taking? Authorizing Provider  promethazine (PHENERGAN) 25 MG tablet Take 25 mg by mouth every 6 (six) hours as  needed for nausea or vomiting.    [provider]    Physical Exam: There were no vitals filed for this visit.   There were no vitals filed for this visit.    Constitutional: Alert and oriented x 3 . Not in any apparent distress HEENT:      Head: Normocephalic and atraumatic.         Eyes: PERLA, EOMI, Conjunctivae are normal. Sclera is non-icteric.       Mouth/Throat: Mucous membranes are moist.       Neck: Supple with no signs of meningismus. Cardiovascular: Regular rate and rhythm. No murmurs, gallops, or rubs. 2+ symmetrical distal pulses are present . No JVD. No LE edema Respiratory: Respiratory effort normal .Lungs sounds clear bilaterally. No wheezes, crackles, or rhonchi.  Gastrointestinal: Soft, non tender, and non distended with positive bowel sounds. No rebound or guarding. Genitourinary: No CVA tenderness. Musculoskeletal: Nontender with normal range of motion in all extremities. No cyanosis, or erythema of extremities. Neurologic: Normal speech and language. Face is symmetric. Moving all extremities. No gross focal neurologic deficits . Skin: Skin is warm, dry.  No rash or ulcers Psychiatric: Mood and affect are normal Speech and behavior are normal   Labs on Admission: I have personally reviewed following labs and imaging studies  CBC: Recent Labs  Lab 05/30/20 1208  WBC 8.1  HGB 15.4  HCT 44.5  MCV 94.9  PLT 293   Basic Metabolic Panel: Recent Labs  Lab 05/30/20 1208  NA 137  K 4.0  CL 100  CO2 25  GLUCOSE 81  BUN 20  CREATININE 1.10  CALCIUM 9.1   GFR: Estimated Creatinine Clearance: 99.9 mL/min (by C-G formula based on SCr of 1.1 mg/dL). Liver Function Tests: Recent Labs  Lab 05/30/20 1208 06/01/20 0724  AST 19 16  ALT 17 13  ALKPHOS 50 45  BILITOT 2.7* 2.0*  PROT 7.5 7.0  ALBUMIN 4.6 4.2   Recent Labs  Lab 05/30/20 1210  LIPASE 21   No results for input(s): AMMONIA in the last 168 hours. Coagulation Profile: No  results for input(s): INR, PROTIME in the last 168 hours. Cardiac Enzymes: No results for input(s): CKTOTAL, CKMB, CKMBINDEX, TROPONINI in the last 168 hours. BNP (last 3 results) No results for input(s): PROBNP in the last 8760 hours. HbA1C: No results for input(s): HGBA1C in the last 72 hours. CBG: No results for input(s): GLUCAP in the last 168 hours. Lipid Profile: Recent Labs    06/01/20 0724  CHOL 206*  HDL 57  LDLCALC 135*  TRIG 70  CHOLHDL 3.6   Thyroid Function Tests: Recent Labs    06/01/20 0724  TSH 1.335   Anemia Panel: No results for input(s): VITAMINB12, FOLATE, FERRITIN, TIBC, IRON, RETICCTPCT in the last 72 hours. Urine analysis:    Component Value Date/Time   COLORURINE AMBER (A) 05/30/2020 1208   APPEARANCEUR HAZY (A) 05/30/2020 1208   LABSPEC 1.028 05/30/2020 1208   PHURINE 5.0 05/30/2020 1208   GLUCOSEU NEGATIVE 05/30/2020 1208   HGBUR NEGATIVE 05/30/2020 1208   BILIRUBINUR NEGATIVE 05/30/2020 1208   KETONESUR 80 (A) 05/30/2020 1208   PROTEINUR 30 (A) 05/30/2020 1208   NITRITE NEGATIVE 05/30/2020 1208   LEUKOCYTESUR NEGATIVE 05/30/2020 1208    Radiological Exams on Admission: DG Abd 1 View  Result Date: 06/01/2020 CLINICAL DATA:  Generalized mid abdominal pain. EXAM: ABDOMEN - 1 VIEW COMPARISON:  None. FINDINGS: Lung bases are clear. Gas is demonstrated within nondilated loops of large and small bowel in a nonobstructed pattern. Osseous structures unremarkable. Phleboliths left hemipelvis. IMPRESSION: Nonobstructed bowel gas pattern.  Nonobstructed bowel gas pattern. Electronically Signed   By: Annia Belt M.D.   On: 06/01/2020 15:30    EKG: Independently reviewed. Interpretation : NSR  Assessment/Plan 39 year old male with history of homelessness, alcohol and polysubstance abuse, admitted from the behavioral health service where he was admitted with major depression with suicidal ideation and IVC, presenting with abdominal pain and vomiting not  responding to empiric treatment    Abdominal pain, suspect acute gastritis   Vomiting -Suspect acute gastritis -IV Protonix, IV antiemetics, IV pain meds -IV fluids -Follow start CBC, CMP, lipase, troponin -EKG showed no a cute ST-T wave changes -Abdominal imaging if necessary    Depression, major, recurrent, severe with psychosis (HCC)   Involuntary commitment -One-to-one suicide precautions -Psych consult    DVT prophylaxis: Lovenox  Code Status: full code  Family Communication:  none  Disposition Plan: Back to previous home environment Consults called: none  Status:observation    Andris Baumann MD [facility]     06/01/2020, 9:01 PM

## 2020-06-01 NOTE — Progress Notes (Signed)
Patient calm and pleasant during assessment denying SI/HI/AVH. Patient endorses anxiety and depression stating it's from not having anywhere to go when he leaves here. Patient given support, encouragement and education. Patient compliant with medication administration per MD orders. Patient observed interacting appropriately with staff and peers on the unit. Patient being monitored Q 15 minutes for safety per unit protocol. Patient remains safe on the unit.

## 2020-06-01 NOTE — Progress Notes (Signed)
Patient has not been able to eat his meal and continues to vomit each time he tries to eat. He is anxious and worried. Continues to report that he is tired of living like this.
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
## 2020-06-01 NOTE — [facility] Suicide Risk Assessment (Signed)
[facility] INPATIENT:  Family/Significant Other Suicide Prevention Education  Suicide Prevention Education:  Patient Refusal for Family/Significant Other Suicide Prevention Education: The patient Steven Khan has refused to provide written consent for family/significant other to be provided Family/Significant Other Suicide Prevention Education during admission and/or prior to discharge.  Physician notified.  CSW went over the SPE brochure with patient and provided a copy   Susa Simmonds 06/01/2020, 12:04 PM

## 2020-06-02 ENCOUNTER — Encounter: Payer: Self-pay | Admitting: Internal Medicine

## 2020-06-02 DIAGNOSIS — R45851 Suicidal ideations: Secondary | ICD-10-CM

## 2020-06-02 DIAGNOSIS — F1410 Cocaine abuse, uncomplicated: Secondary | ICD-10-CM

## 2020-06-02 DIAGNOSIS — F333 Major depressive disorder, recurrent, severe with psychotic symptoms: Secondary | ICD-10-CM

## 2020-06-02 LAB — COMPREHENSIVE METABOLIC PANEL
ALT: 12 U/L (ref 0–44)
AST: 15 U/L (ref 15–41)
Albumin: 3.9 g/dL (ref 3.5–5.0)
Alkaline Phosphatase: 40 U/L (ref 38–126)
Anion gap: 9 (ref 5–15)
BUN: 12 mg/dL (ref 6–20)
CO2: 25 mmol/L (ref 22–32)
Calcium: 8.5 mg/dL — ABNORMAL LOW (ref 8.9–10.3)
Chloride: 103 mmol/L (ref 98–111)
Creatinine, Ser: 0.97 mg/dL (ref 0.61–1.24)
GFR calc Af Amer: >60 mL/min (ref >60)
GFR calc non Af Amer: >60 mL/min (ref >60)
Glucose, Bld: 100 mg/dL — ABNORMAL HIGH (ref 70–99)
Potassium: 3.7 mmol/L (ref 3.5–5.1)
Sodium: 137 mmol/L (ref 135–145)
Total Bilirubin: 2.1 mg/dL — ABNORMAL HIGH (ref 0.3–1.2)
Total Protein: 6.5 g/dL (ref 6.5–8.1)

## 2020-06-02 LAB — HIV ANTIBODY (ROUTINE TESTING W REFLEX): HIV Screen 4th Generation wRfx: NONREACTIVE

## 2020-06-02 LAB — CBC
HCT: 40.6 % (ref 39.0–52.0)
Hemoglobin: 14.7 g/dL (ref 13.0–17.0)
MCH: 33 pg (ref 26.0–34.0)
MCHC: 36.2 g/dL — ABNORMAL HIGH (ref 30.0–36.0)
MCV: 91.2 fL (ref 80.0–100.0)
Platelets: 296 10*3/uL (ref 150–400)
RBC: 4.45 MIL/uL (ref 4.22–5.81)
RDW: 14.1 % (ref 11.5–15.5)
WBC: 7.4 10*3/uL (ref 4.0–10.5)
nRBC: 0 % (ref 0.0–0.2)

## 2020-06-02 LAB — LIPASE, BLOOD: Lipase: 23 U/L (ref 11–51)

## 2020-06-02 LAB — TROPONIN I (HIGH SENSITIVITY)
Troponin I (High Sensitivity): <2 ng/L (ref <18)
Troponin I (High Sensitivity): <2 ng/L (ref <18)

## 2020-06-02 MED ORDER — QUETIAPINE FUMARATE 25 MG PO TABS
12.5000 mg | ORAL_TABLET | Freq: Every day | ORAL | Status: DC
  Administered 2020-06-02: 12.5 mg via ORAL
  Filled 2020-06-02: qty 1

## 2020-06-02 NOTE — Progress Notes (Signed)
PROGRESS NOTE    Steven Khan  AOZ:[id number] DOB: 09/09/1981 DOA: 06/01/2020 PCP: Patient, No Pcp Per    Assessment & Plan:   Principal Problem:   Abdominal pain Active Problems:   Depression, major, recurrent, severe with psychosis (HCC)   Involuntary commitment   Vomiting   Intractable abdominal pain   Abdominal pain: w/ recurrent vomiting 2.5 months. Etiology unclear, possibly secondary to acute gastritis. Continue on IV protonix. Zofran prn for nausea/vomiting. Continue on IVFs. XR abd shows nonobstructed bowel gas pattern   Major Depression: with psychosis. Involuntary commitment. One-to-one suicide precautions. Psych consulted   Ilicit drug abuse: urine drug screen positive for cocaine, marijuana. Illicit drug abuse cessation counseling    DVT prophylaxis: SCDs Code Status: full  Family Communication: none listed but denies having any family or friends around to help him  Disposition Plan: unknown until psych sees the pt  Consultants:   Psych   Procedures:    Antimicrobials:   Subjective: Pt c/o abd pain & vomiting.   Objective: Vitals:   06/01/20 2240 06/02/20 0415  BP:  (!) 144/91  Pulse:  76  Temp:  98.4 F (36.9 C)  TempSrc:  Oral  SpO2:  98%  Weight: 87.7 kg 87.5 kg  Height: 6' (1.829 m)     Intake/Output Summary (Last 24 hours) at 06/02/2020 0737 Last data filed at 06/02/2020 0500 Gross per 24 hour  Intake 240 ml  Output 400 ml  Net -160 ml   Filed Weights   06/01/20 2240 06/02/20 0415  Weight: 87.7 kg 87.5 kg    Examination:  General exam: Appears agitated  Respiratory system: Clear to auscultation. Respiratory effort normal. Cardiovascular system: S1 & S2 +. No rubs, gallops or clicks.  Gastrointestinal system: Abdomen is nondistended, soft and nontender. Normal bowel sounds heard. Central nervous system: Alert and oriented. Moves all 4 extremities Psychiatry: Judgement and insight appear abnormal. Agitated &  frustrated    Data Reviewed: I have personally reviewed following labs and imaging studies  CBC: Recent Labs  Lab 05/30/20 1208 06/02/20 0201  WBC 8.1 7.4  HGB 15.4 14.7  HCT 44.5 40.6  MCV 94.9 91.2  PLT 293 296   Basic Metabolic Panel: Recent Labs  Lab 05/30/20 1208 06/02/20 0201  NA 137 137  K 4.0 3.7  CL 100 103  CO2 25 25  GLUCOSE 81 100*  BUN 20 12  CREATININE 1.10 0.97  CALCIUM 9.1 8.5*   GFR: Estimated Creatinine Clearance: 113.3 mL/min (by C-G formula based on SCr of 0.97 mg/dL). Liver Function Tests: Recent Labs  Lab 05/30/20 1208 06/01/20 0724 06/02/20 0201  AST 19 16 15   ALT 17 13 12   ALKPHOS 50 45 40  BILITOT 2.7* 2.0* 2.1*  PROT 7.5 7.0 6.5  ALBUMIN 4.6 4.2 3.9   Recent Labs  Lab 05/30/20 1210 06/02/20 0201  LIPASE 21 23   No results for input(s): AMMONIA in the last 168 hours. Coagulation Profile: No results for input(s): INR, PROTIME in the last 168 hours. Cardiac Enzymes: No results for input(s): CKTOTAL, CKMB, CKMBINDEX, TROPONINI in the last 168 hours. BNP (last 3 results) No results for input(s): PROBNP in the last 8760 hours. HbA1C: No results for input(s): HGBA1C in the last 72 hours. CBG: No results for input(s): GLUCAP in the last 168 hours. Lipid Profile: Recent Labs    06/01/20 0724  CHOL 206*  HDL 57  LDLCALC 135*  TRIG 70  CHOLHDL 3.6   Thyroid Function Tests:  Recent Labs    06/01/20 0724  TSH 1.335   Anemia Panel: No results for input(s): VITAMINB12, FOLATE, FERRITIN, TIBC, IRON, RETICCTPCT in the last 72 hours. Sepsis Labs: No results for input(s): PROCALCITON, LATICACIDVEN in the last 168 hours.  Recent Results (from the past 240 hour(s))  SARS Coronavirus 2 by RT PCR (hospital order, performed in [facility] hospital lab) Nasopharyngeal Nasopharyngeal Swab     Status: None   Collection Time: 05/30/20  4:11 PM   Specimen: Nasopharyngeal Swab  Result Value Ref Range Status   SARS Coronavirus 2  NEGATIVE NEGATIVE Final    Comment: (NOTE) SARS-CoV-2 target nucleic acids are NOT DETECTED.  The SARS-CoV-2 RNA is generally detectable in upper and lower respiratory specimens during the acute phase of infection. The lowest concentration of SARS-CoV-2 viral copies this assay can detect is 250 copies / mL. A negative result does not preclude SARS-CoV-2 infection and should not be used as the sole basis for treatment or other patient management decisions.  A negative result may occur with improper specimen collection / handling, submission of specimen other than nasopharyngeal swab, presence of viral mutation(s) within the areas targeted by this assay, and inadequate number of viral copies (<250 copies / mL). A negative result must be combined with clinical observations, patient history, and epidemiological information.  Fact Sheet for Patients:   BoilerBrush.com.cy  Fact Sheet for Healthcare Providers: https://pope.com/  This test is not yet approved or  cleared by the [place] FDA and has been authorized for detection and/or diagnosis of SARS-CoV-2 by FDA under an Emergency Use Authorization (EUA).  This EUA will remain in effect (meaning this test can be used) for the duration of the COVID-19 declaration under Section 564(b)(1) of the Act, 21 U.S.C. section 360bbb-3(b)(1), unless the authorization is terminated or revoked sooner.  Performed at [facility], [address], [place], [place] [address]          Radiology Studies: DG Abd 1 View  Result Date: 06/01/2020 CLINICAL DATA:  Generalized mid abdominal pain. EXAM: ABDOMEN - 1 VIEW COMPARISON:  None. FINDINGS: Lung bases are clear. Gas is demonstrated within nondilated loops of large and small bowel in a nonobstructed pattern. Osseous structures unremarkable. Phleboliths left hemipelvis. IMPRESSION: Nonobstructed bowel gas pattern.  Nonobstructed bowel gas  pattern. Electronically Signed   By: Annia Belt M.D.   On: 06/01/2020 15:30        Scheduled Meds: . enoxaparin (LOVENOX) injection  40 mg Subcutaneous Q24H   Continuous Infusions: . sodium chloride 100 mL/hr at 06/01/20 2320     LOS: 0 days    Time spent: 35 mins     Charise Killian, MD [facility] Pager [phone]  If 7PM-7AM, please contact night-coverage [email or web address]  06/02/2020, 7:37 AM

## 2020-06-02 NOTE — Progress Notes (Deleted)
Discharge instructions reviewed with pt. Opportunity for questions made available. Pt was education on how to administer subq insulin. Pt was able to demonstrate self insulin administration. Pt verbalized understanding of d/c instructions. IV & monitor removed for d/c home. Family to transport pt home.

## 2020-06-02 NOTE — Plan of Care (Signed)

## 2020-06-02 NOTE — Progress Notes (Addendum)
[facility] MD Progress Note  06/02/2020 1:18 PM Steven Khan  MRN:  [id number] Subjective:   I am still in pain   Principal Problem: Abdominal pain Diagnosis: Principal Problem:   Abdominal pain Active Problems:   Depression, major, recurrent, severe with psychosis (HCC)   Involuntary commitment   Vomiting   Intractable abdominal pain  Total Time spent with patient: 30-40 Past Psychiatric History:   Med mgt and psych observation while in jail  Two years of homelessness and on and off drug use.  Now in psych for SI possible plans ---and depression.  On and off cocaine amphetamine MJ and ETOH use -----  Today he is on med unit with work up for abdom pain after being in psych.     He is sad, and depressed reflecting on past jail and drug issues, PTSD in jail and need for recovery that he has not actively pursued on regular basis   No recent inpatient or outpatient treatment willing to go to some form of longer recovery at least he says for now   Otherwise still faces h omeless state at Psych discharge if he does not.     Past Medical History: History reviewed. No pertinent past medical history.  Past Surgical History:  Procedure Laterality Date  . HERNIA REPAIR     Family History: History reviewed. No pertinent family history. Family Psychiatric  History:  Parents with major depression anxiety and polysubstance dependence still using   Social History:  Homeless for two years post jail time for breaking and entering  Social History   Substance and Sexual Activity  Alcohol Use Yes     Social History   Substance and Sexual Activity  Drug Use Yes  . Types: Cocaine, Marijuana   Comment: ecstasy    Social History   Socioeconomic History  . Marital status: Single    Spouse name: Not on file  . Number of children: Not on file  . Years of education: Not on file  . Highest education level: Not on file  Occupational History  . Not on file  Tobacco Use  . Smoking status:  Current Every Day Smoker  . Smokeless tobacco: Never Used  Substance and Sexual Activity  . Alcohol use: Yes  . Drug use: Yes    Types: Cocaine, Marijuana    Comment: ecstasy  . Sexual activity: Not on file  Other Topics Concern  . Not on file  Social History Narrative  . Not on file   Social Determinants of Health   Financial Resource Strain:   . Difficulty of Paying Living Expenses:   Food Insecurity:   . Worried About Programme researcher, broadcasting/film/video in the Last Year:   . Barista in the Last Year:   Transportation Needs:   . Freight forwarder (Medical):   Marland Kitchen Lack of Transportation (Non-Medical):   Physical Activity:   . Days of Exercise per Week:   . Minutes of Exercise per Session:   Stress:   . Feeling of Stress :   Social Connections:   . Frequency of Communication with Friends and Family:   . Frequency of Social Gatherings with Friends and Family:   . Attends Religious Services:   . Active Member of Clubs or Organizations:   . Attends Banker Meetings:   Marland Kitchen Marital Status:    Additional Social History:   Seeks some form of recovery house after psych inpatient admission and after med clearance.  Sleep:  Fair   Appetite:  Poor due to papin   Current Medications: Current Facility-Administered Medications  Medication Dose Route Frequency Provider Last Rate Last Admin  . 0.9 %  sodium chloride infusion   Intravenous Continuous Andris Baumannuncan, Hazel V, MD 100 mL/hr at 06/02/20 0918 New Bag at 06/02/20 0918  . acetaminophen (TYLENOL) tablet 650 mg  650 mg Oral Q6H PRN Andris Baumannuncan, Hazel V, MD       Or  . acetaminophen (TYLENOL) suppository 650 mg  650 mg Rectal Q6H PRN Andris Baumannuncan, Hazel V, MD      . enoxaparin (LOVENOX) injection 40 mg  40 mg Subcutaneous Q24H Andris Baumannuncan, Hazel V, MD   40 mg at 06/01/20 2322  . HYDROcodone-acetaminophen (NORCO/VICODIN) 5-325 MG per tablet 1-2 tablet  1-2 tablet Oral Q4H PRN Andris Baumannuncan, Hazel V, MD   2 tablet  at 06/02/20 1041  . morphine 2 MG/ML injection 2 mg  2 mg Intravenous Q2H PRN Andris Baumannuncan, Hazel V, MD   2 mg at 06/02/20 1145  . ondansetron (ZOFRAN) tablet 4 mg  4 mg Oral Q6H PRN Andris Baumannuncan, Hazel V, MD       Or  . ondansetron [facility](ZOFRAN) injection 4 mg  4 mg Intravenous Q6H PRN Andris Baumannuncan, Hazel V, MD   4 mg at 06/02/20 1041  . QUEtiapine (SEROQUEL) tablet 12.5 mg  12.5 mg Oral QHS Roselind Messierao, Teyona Nichelson, MD      . senna-docusate (Senokot-S) tablet 1 tablet  1 tablet Oral QHS PRN Andris Baumannuncan, Hazel V, MD        Lab Results:  Results for orders placed or performed during the hospital encounter of 06/01/20 (from the past 48 hour(s))  HIV Antibody (routine testing w rflx)     Status: None   Collection Time: 06/02/20  2:01 AM  Result Value Ref Range   HIV Screen 4th Generation wRfx Non Reactive Non Reactive    Comment: Performed at [facility], [address] [place], [place] [address]  Troponin I (High Sensitivity)     Status: None   Collection Time: 06/02/20  2:01 AM  Result Value Ref Range   Troponin I (High Sensitivity) <2 <18 ng/L    Comment: (NOTE) Elevated high sensitivity troponin I (hsTnI) values and significant  changes across serial measurements may suggest ACS but many other  chronic and acute conditions are known to elevate hsTnI results.  Refer to the "Links" section for chest pain algorithms and additional  guidance. Performed at [facility], [address] [place], [place] [address]   Lipase, blood     Status: None   Collection Time: 06/02/20  2:01 AM  Result Value Ref Range   Lipase 23 11 - 51 U/L    Comment: Performed at [facility], [address] [place], [place] [address]  Comprehensive metabolic panel     Status: Abnormal   Collection Time: 06/02/20  2:01 AM  Result Value Ref Range   Sodium 137 135 - 145 mmol/L   Potassium 3.7 3.5 - 5.1 mmol/L   Chloride 103 98 - 111 mmol/L   CO2 25 22 - 32 mmol/L   Glucose, Bld 100 (H) 70 - 99 mg/dL    Comment:  Glucose reference range applies only to samples taken after fasting for at least 8 hours.   BUN 12 6 - 20 mg/dL   Creatinine, Ser 2.130.97 0.61 - 1.24 mg/dL   Calcium 8.5 (L) 8.9 - 10.3 mg/dL   Total Protein 6.5 6.5 -  8.1 g/dL   Albumin 3.9 3.5 - 5.0 g/dL   AST 15 15 - 41 U/L   ALT 12 0 - 44 U/L   Alkaline Phosphatase 40 38 - 126 U/L   Total Bilirubin 2.1 (H) 0.3 - 1.2 mg/dL   GFR calc non Af Amer >60 >60 mL/min   GFR calc Af Amer >60 >60 mL/min   Anion gap 9 5 - 15    Comment: Performed at [facility], [address] [place], [place] [address]  CBC     Status: Abnormal   Collection Time: 06/02/20  2:01 AM  Result Value Ref Range   WBC 7.4 4.0 - 10.5 K/uL   RBC 4.45 4.22 - 5.81 MIL/uL   Hemoglobin 14.7 13.0 - 17.0 g/dL   HCT 72.5 39 - 52 %   MCV 91.2 80.0 - 100.0 fL   MCH 33.0 26.0 - 34.0 pg   MCHC 36.2 (H) 30.0 - 36.0 g/dL   RDW 36.6 44.0 - 34.7 %   Platelets 296 150 - 400 K/uL   nRBC 0.0 0.0 - 0.2 %    Comment: Performed at [facility], [address], [place], [place] [address]  Troponin I (High Sensitivity)     Status: None   Collection Time: 06/02/20  5:17 AM  Result Value Ref Range   Troponin I (High Sensitivity) <2 <18 ng/L    Comment: (NOTE) Elevated high sensitivity troponin I (hsTnI) values and significant  changes across serial measurements may suggest ACS but many other  chronic and acute conditions are known to elevate hsTnI results.  Refer to the "Links" section for chest pain algorithms and additional  guidance. Performed at [facility], [address] [place], [place] [address]     Blood Alcohol level:  Lab Results  Component Value Date   [facility] <10 05/30/2020    Metabolic Disorder Labs: No results found for: HGBA1C, MPG No results found for: PROLACTIN Lab Results  Component Value Date   CHOL 206 (H) 06/01/2020   TRIG 70 06/01/2020   HDL 57 06/01/2020   CHOLHDL 3.6 06/01/2020   VLDL 14 06/01/2020   LDLCALC 135 (H)  06/01/2020    Physical Findings: AIMS:  , ,  ,  ,   not done yet  CIWA:    COWS:     Musculoskeletal: Strength & Muscle Tone: normal  Gait & Station: normal  Patient leans: n/a   Psychiatric Specialty Exam: Physical Exam  Review of Systems  Blood pressure 131/79, pulse 65, temperature 98.7 F (37.1 C), resp. rate 20, height 6' (1.829 m), weight 87.5 kg, SpO2 100 %.Body mass index is 26.18 kg/m.  Mental Status   Alert cooperative /oriented to person place date and time  Consciousness not clouded or fluctuant -- Rapport okay  Eye contact fair  Mood depressed affect constricted Says he does not need a sitter He signed in voluntary on the psych unit Thought process and content --depressive and anxious themes PTSD themes  Judgement insight reliability fair  Speech low tone rate volume ---fluency okay Concentration and attention okay Memory remote recent immediate okay through general questions Fund of knowledge intelligence fair  SI and HI contracts for safety but remains depressed with passive SI  Abstraction normal Appearance normal   No movement problems, shakes tics tremors   ADL's okay Cognition normal Leans --n/a  Recall normal Language normal English Akathisia none Handedness ---not known Sleep on and off  Treatment Plan Summary:  Made supportive rounds pending transfer back to Psych to finish his stay there.  Needs Psych SW interventions for half way house and all prior to discharge.  Low dose Seroquel started at HS   Pending finishing of workup for pain and abdom discomfort at this time   Currently voluntary status without sitter need at this point       Roselind Messier, MD 06/02/2020, 1:18 PM

## 2020-06-03 ENCOUNTER — Other Ambulatory Visit: Payer: Self-pay

## 2020-06-03 DIAGNOSIS — R112 Nausea with vomiting, unspecified: Secondary | ICD-10-CM

## 2020-06-03 DIAGNOSIS — R1013 Epigastric pain: Secondary | ICD-10-CM

## 2020-06-03 DIAGNOSIS — R109 Unspecified abdominal pain: Secondary | ICD-10-CM

## 2020-06-03 DIAGNOSIS — R45851 Suicidal ideations: Secondary | ICD-10-CM

## 2020-06-03 LAB — CBC
HCT: 39.2 % (ref 39.0–52.0)
Hemoglobin: 14 g/dL (ref 13.0–17.0)
MCH: 33.2 pg (ref 26.0–34.0)
MCHC: 35.7 g/dL (ref 30.0–36.0)
MCV: 92.9 fL (ref 80.0–100.0)
Platelets: 258 10*3/uL (ref 150–400)
RBC: 4.22 MIL/uL (ref 4.22–5.81)
RDW: 14.7 % (ref 11.5–15.5)
WBC: 7 10*3/uL (ref 4.0–10.5)
nRBC: 0 % (ref 0.0–0.2)

## 2020-06-03 LAB — COMPREHENSIVE METABOLIC PANEL
ALT: 11 U/L (ref 0–44)
AST: 12 U/L — ABNORMAL LOW (ref 15–41)
Albumin: 3.4 g/dL — ABNORMAL LOW (ref 3.5–5.0)
Alkaline Phosphatase: 39 U/L (ref 38–126)
Anion gap: 8 (ref 5–15)
BUN: 9 mg/dL (ref 6–20)
CO2: 26 mmol/L (ref 22–32)
Calcium: 8.4 mg/dL — ABNORMAL LOW (ref 8.9–10.3)
Chloride: 103 mmol/L (ref 98–111)
Creatinine, Ser: 1.14 mg/dL (ref 0.61–1.24)
GFR calc Af Amer: >60 mL/min (ref >60)
GFR calc non Af Amer: >60 mL/min (ref >60)
Glucose, Bld: 85 mg/dL (ref 70–99)
Potassium: 4.2 mmol/L (ref 3.5–5.1)
Sodium: 137 mmol/L (ref 135–145)
Total Bilirubin: 1.7 mg/dL — ABNORMAL HIGH (ref 0.3–1.2)
Total Protein: 5.7 g/dL — ABNORMAL LOW (ref 6.5–8.1)

## 2020-06-03 MED ORDER — QUETIAPINE FUMARATE 25 MG PO TABS
25.0000 mg | ORAL_TABLET | Freq: Every day | ORAL | Status: DC
  Administered 2020-06-03: 25 mg via ORAL
  Filled 2020-06-03: qty 1

## 2020-06-03 MED ORDER — DOCUSATE SODIUM 100 MG PO CAPS
100.0000 mg | ORAL_CAPSULE | Freq: Two times a day (BID) | ORAL | Status: DC | PRN
  Administered 2020-06-03: 100 mg via ORAL
  Filled 2020-06-03: qty 1

## 2020-06-03 NOTE — TOC Initial Note (Signed)
Transition of Care [facility]) - Initial/Assessment Note    Patient Details  Name: Steven Khan MRN: [id number] Date of Birth: Nov 03, 1980  [facility] CM/SW Contact:    Shawn Route, RN Phone Number: 06/03/2020, 10:04 AM  Clinical Narrative:                  Patient reports he lives at address on file and uses the phone number listed.  He states he does not have a dr or insurance.  I told him his medications would be sent to medication management and I was sending a referral to [facility] to establish a PCP.  He is in agreement with this.   Expected Discharge Plan: Home/Self Care Barriers to Discharge: Continued Medical Work up   Patient Goals and CMS Choice        Expected Discharge Plan and Services Expected Discharge Plan: Home/Self Care   Discharge Planning Services: CM Consult, Medication Assistance, Indigent Health Clinic   Living arrangements for the past 2 months: Single Family Home                                      Prior Living Arrangements/Services Living arrangements for the past 2 months: Single Family Home Lives with:: Self Patient language and need for interpreter reviewed:: No Do you feel safe going back to the place where you live?: Yes      Need for Family Participation in Patient Care: No (Comment) Care giver support system in place?: No (comment)   Criminal Activity/Legal Involvement Pertinent to Current Situation/Hospitalization: No - Comment as needed  Activities of Daily Living      Permission Sought/Granted                  Emotional Assessment       Orientation: : Oriented to Self, Oriented to Place, Oriented to  Time, Oriented to Situation Alcohol / Substance Use: Illicit Drugs Psych Involvement: No (comment)  Admission diagnosis:  Intractable abdominal pain [R10.9] Abdominal pain [R10.9] Suicidal ideations [R45.851] Patient Active Problem List   Diagnosis Date Noted  . Suicidal ideations  06/02/2020  . PTSD (post-traumatic stress disorder) 06/01/2020  . Alcohol abuse 06/01/2020  . Cocaine abuse (HCC) 06/01/2020  . Abdominal pain 06/01/2020  . Involuntary commitment 06/01/2020  . Vomiting 06/01/2020  . Intractable abdominal pain 06/01/2020  . Depression, major, recurrent, severe with psychosis (HCC) 05/31/2020   PCP:  Patient, No Pcp Per Pharmacy:  No Pharmacies Listed    Social Determinants of Health (SDOH) Interventions    Readmission Risk Interventions No flowsheet data found.

## 2020-06-03 NOTE — Plan of Care (Signed)

## 2020-06-03 NOTE — Progress Notes (Signed)
Pt called out concerned that he tried to have a bowel movement but was unable to, when he cleaned himself he visualized red blood. MD notified will continue to monitor.

## 2020-06-03 NOTE — Progress Notes (Signed)
Pt sitting at the side of the bed on his knees, bent over the side of the bed complaining of stomach pain. PRN morphine administered.

## 2020-06-03 NOTE — Progress Notes (Signed)
CH visited pt. while rounding on 2A; pt. lying in bed watching TV, awake, agreeable to visit.  Pt. shared that he came to the hospital with severe abdominal pain and thoughts of killing himself; pt. shared at length about his faith in God and how his parents' Pt. shared several experiences in which he feels that God used him to encourage or speak prophetically to other people in his life.  Pt. shared frustration with God at the moment however --is wrestling with how God has allowed him to go through so much recent suffering.  Pt. admits to using drugs and says that one of the factors that led to him getting reinvolved in this addiction was that his wife left him while he was in prison.  CH asked pt. what the primary thing he needs from God right now is and pt. asked for prayer for better mental health so he can be a provider for his 8yo and 14yo sons.  CH prayed with pt. for these concerns and for a renewed sense of God's love for himself.  Pt. requested a Bible which CH brought later this PM.  CH hopes to follow up w/pt. later this week if possible.

## 2020-06-03 NOTE — Plan of Care (Signed)
  Problem: Clinical Measurements: Goal: Ability to maintain clinical measurements within normal limits will improve Outcome: Progressing Goal: Cardiovascular complication will be avoided Outcome: Progressing   Problem: Activity: Goal: Risk for activity intolerance will decrease Outcome: Progressing   Problem: Pain Managment: Goal: General experience of comfort will improve Outcome: Progressing   Problem: Safety: Goal: Ability to remain free from injury will improve Outcome: Progressing   

## 2020-06-03 NOTE — Consult Note (Signed)
Wyline Mood , MD [address], [address], [place], [place], [address] [address], [place], [place], [address] Phone: [phone]  Fax: [phone]  Consultation  Referring Provider:     Dr Mayford Knife Primary Care Physician:  Patient, No Pcp Per Primary Gastroenterologist:  None          Reason for Consultation:     Nausea and vomiting   Date of Admission:  06/01/2020 Date of Consultation:  06/03/2020         HPI:   Steven Khan is a 39 y.o. male was admitted for abdominal pain and vomiting on 06/01/2020.  Had a history of severe depression and psychosis.  Pain began on 06/01/2020.  On admission CBC, CMP was normal.  Glucose 100 mg/dL.  Lipase was negative.  Troponin was also negative.  He had an x-ray of the abdomen on 06/01/2020 which was normal.  Review of care everywhere.Suggest that the patient was seen at the ER at [facility] on 05/06/2020 for abdominal pain.  Given promethazine and discharged home.  ED note mentions that he may have had a cannabinoid hyperemesis syndrome in the past.  Toxicology screen at [facility] was positive for cocaine and cannabinoids.  CT abdomen and pelvis in June 2021 demonstrated no acute abnormalities.Urine drug screen on 05/19/2020 was positive for cannabinoids and cocaine.  He has also been seen at our ER on a few occasions for abdominal pain. Recently he attempted a suicide with cocaine intoxication. Says the pain is usually in the epigastrium, non radiating , worse with food inatake and associated with nausea and viomiting . The vomiting is relieved with a hot shower. Uses marijuana regularly but no cocaine. No Prior EGD .     History reviewed. No pertinent past medical history.  Past Surgical History:  Procedure Laterality Date  . HERNIA REPAIR      Prior to Admission medications   Medication Sig Start Date End Date Taking? Authorizing Provider  promethazine (PHENERGAN) 25 MG tablet Take 25 mg by mouth every 6 (six) hours as needed for nausea or  vomiting.    [provider]    History reviewed. No pertinent family history.   Social History   Tobacco Use  . Smoking status: Current Every Day Smoker  . Smokeless tobacco: Never Used  Substance Use Topics  . Alcohol use: Yes  . Drug use: Yes    Types: Cocaine, Marijuana    Comment: ecstasy    Allergies as of 06/01/2020  . (No Known Allergies)    Review of Systems:    All systems reviewed and negative except where noted in HPI.   Physical Exam:  Vital signs in last 24 hours: Temp:  [97.7 F (36.5 C)-98.6 F (37 C)] 97.7 F (36.5 C) (08/16 1229) Pulse Rate:  [62-69] 69 (08/16 1229) Resp:  [16-18] 17 (08/16 1229) BP: (120-143)/(77-96) 143/96 (08/16 1229) SpO2:  [99 %-100 %] 100 % (08/16 1229) Weight:  [87.2 kg] 87.2 kg (08/16 0646) Last BM Date: 05/31/20 General:   Pleasant, cooperative in NAD Head:  Normocephalic and atraumatic. Eyes:   No icterus.   Conjunctiva pink. PERRLA. Ears:  Normal auditory acuity. Neck:  Supple; no masses or thyroidomegaly Lungs: Respirations even and unlabored. Lungs clear to auscultation bilaterally.   No wheezes, crackles, or rhonchi.  Heart:  Regular rate and rhythm;  Without murmur, clicks, rubs or gallops Abdomen:  Soft, nondistended, nontender. Normal bowel sounds. No appreciable masses or hepatomegaly.  No rebound or  guarding.  Neurologic:  Alert and oriented x3;  grossly normal neurologically. Psych:  Alert and cooperative. Normal affect.  LAB RESULTS: Recent Labs    06/02/20 0201 06/03/20 0631  WBC 7.4 7.0  HGB 14.7 14.0  HCT 40.6 39.2  PLT 296 258   BMET Recent Labs    06/02/20 0201 06/03/20 0631  NA 137 137  K 3.7 4.2  CL 103 103  CO2 25 26  GLUCOSE 100* 85  BUN 12 9  CREATININE 0.97 1.14  CALCIUM 8.5* 8.4*   LFT Recent Labs    06/01/20 0724 06/02/20 0201 06/03/20 0631  PROT 7.0   < > 5.7*  ALBUMIN 4.2   < > 3.4*  AST 16   < > 12*  ALT 13   < > 11  ALKPHOS 45   < > 39  BILITOT 2.0*   < >  1.7*  BILIDIR 0.2  --   --   IBILI 1.8*  --   --    < > = values in this interval not displayed.   PT/INR No results for input(s): LABPROT, INR in the last 72 hours.  STUDIES: No results found.    Impression / Plan:   Steven Khan is a 39 y.o. y/o male with a history of recurrent abdominal pain , substance abuse , severe depression. I have been asked to see him for recurrent nausea and vomiting.    Plan 1.  Suggest to avoid all substances such as cocaine and cannabis.  Cannabis can cause cannabinoid hyperemesis syndrome with nausea and vomiting. Treatment for the same is to avoid it.  Cocaine can cause abdominal pain, chest pain.  At this point of time cannot perform any endoscopic evaluation due to presence of cocaine in his system.  I would suggest we check him for H. pylori and if positive can be treated in the interim treat him for empirically acid reflux disease with PPI.  Advance diet as tolerated.  2. As an outpatient would consider EGD once off all cocaine.   Thank you for involving me in the care of this patient.      LOS: 1 day   Wyline Mood, MD  06/03/2020, 4:32 PM

## 2020-06-03 NOTE — Progress Notes (Signed)
PROGRESS NOTE    Steven Khan  RCV:[id number] DOB: [birth year] DOA: 06/01/2020 PCP: Patient, No Pcp Per    Assessment & Plan:   Principal Problem:   Abdominal pain Active Problems:   Depression, major, recurrent, severe with psychosis (HCC)   Involuntary commitment   Vomiting   Intractable abdominal pain   Suicidal ideations   Abdominal pain: w/ recurrent vomiting 2.5 months. Etiology unclear, possibly secondary to acute gastritis. Continue on IV protonix. Zofran prn for nausea/vomiting. Continue on IVFs. XR abd shows nonobstructed bowel gas pattern. GI consulted   Major Depression: with psychosis. Voluntary commitment.  Psych following and recs apprec  Ilicit drug abuse: urine drug screen positive for cocaine, marijuana. Illicit drug abuse cessation counseling    DVT prophylaxis: SCDs Code Status: full  Family Communication: none listed but denies having any family or friends around to help him  Disposition Plan: likely d/c to inpatient psych facility   Consultants:   Psych   Procedures:    Antimicrobials:   Subjective: Pt c/o nausea & "almost vomiting."  Objective: Vitals:   06/03/20 0720 06/03/20 0915 06/03/20 1110 06/03/20 1229  BP: 120/78   (!) 143/96  Pulse: 62   69  Resp: 17 18 18 17   Temp: 98.6 F (37 C)   97.7 F (36.5 C)  TempSrc: Oral   Oral  SpO2: 99%   100%  Weight:      Height:        Intake/Output Summary (Last 24 hours) at 06/03/2020 1255 Last data filed at 06/03/2020 0300 Gross per 24 hour  Intake 3249.54 ml  Output 1050 ml  Net 2199.54 ml   Filed Weights   06/01/20 2240 06/02/20 0415 06/03/20 0646  Weight: 87.7 kg 87.5 kg 87.2 kg    Examination:  General exam: Appears agitated  Respiratory system: clear breath sounds b/l. No wheezes, rales Cardiovascular system: S1 & S2 +. No rubs, gallops or clicks.  Gastrointestinal system: Abdomen is nondistended, soft and nontender. Hypoactive  bowel sounds heard. Central nervous  system: Alert and oriented. Moves all 4 extremities Psychiatry: Judgement and insight appear abnormal. Agitated & frustrated    Data Reviewed: I have personally reviewed following labs and imaging studies  CBC: Recent Labs  Lab 05/30/20 1208 06/02/20 0201 06/03/20 0631  WBC 8.1 7.4 7.0  HGB 15.4 14.7 14.0  HCT 44.5 40.6 39.2  MCV 94.9 91.2 92.9  PLT 293 296 258   Basic Metabolic Panel: Recent Labs  Lab 05/30/20 1208 06/02/20 0201 06/03/20 0631  NA 137 137 137  K 4.0 3.7 4.2  CL 100 103 103  CO2 25 25 26   GLUCOSE 81 100* 85  BUN 20 12 9   CREATININE 1.10 0.97 1.14  CALCIUM 9.1 8.5* 8.4*   GFR: Estimated Creatinine Clearance: 96.4 mL/min (by C-G formula based on SCr of 1.14 mg/dL). Liver Function Tests: Recent Labs  Lab 05/30/20 1208 06/01/20 0724 06/02/20 0201 06/03/20 0631  AST 19 16 15  12*  ALT 17 13 12 11   ALKPHOS 50 45 40 39  BILITOT 2.7* 2.0* 2.1* 1.7*  PROT 7.5 7.0 6.5 5.7*  ALBUMIN 4.6 4.2 3.9 3.4*   Recent Labs  Lab 05/30/20 1210 06/02/20 0201  LIPASE 21 23   No results for input(s): AMMONIA in the last 168 hours. Coagulation Profile: No results for input(s): INR, PROTIME in the last 168 hours. Cardiac Enzymes: No results for input(s): CKTOTAL, CKMB, CKMBINDEX, TROPONINI in the last 168 hours. BNP (last 3 results) No  results for input(s): PROBNP in the last 8760 hours. HbA1C: No results for input(s): HGBA1C in the last 72 hours. CBG: No results for input(s): GLUCAP in the last 168 hours. Lipid Profile: Recent Labs    06/01/20 0724  CHOL 206*  HDL 57  LDLCALC 135*  TRIG 70  CHOLHDL 3.6   Thyroid Function Tests: Recent Labs    06/01/20 0724  TSH 1.335   Anemia Panel: No results for input(s): VITAMINB12, FOLATE, FERRITIN, TIBC, IRON, RETICCTPCT in the last 72 hours. Sepsis Labs: No results for input(s): PROCALCITON, LATICACIDVEN in the last 168 hours.  Recent Results (from the past 240 hour(s))  SARS Coronavirus 2 by RT PCR  (hospital order, performed in [facility] hospital lab) Nasopharyngeal Nasopharyngeal Swab     Status: None   Collection Time: 05/30/20  4:11 PM   Specimen: Nasopharyngeal Swab  Result Value Ref Range Status   SARS Coronavirus 2 NEGATIVE NEGATIVE Final    Comment: (NOTE) SARS-CoV-2 target nucleic acids are NOT DETECTED.  The SARS-CoV-2 RNA is generally detectable in upper and lower respiratory specimens during the acute phase of infection. The lowest concentration of SARS-CoV-2 viral copies this assay can detect is 250 copies / mL. A negative result does not preclude SARS-CoV-2 infection and should not be used as the sole basis for treatment or other patient management decisions.  A negative result may occur with improper specimen collection / handling, submission of specimen other than nasopharyngeal swab, presence of viral mutation(s) within the areas targeted by this assay, and inadequate number of viral copies (<250 copies / mL). A negative result must be combined with clinical observations, patient history, and epidemiological information.  Fact Sheet for Patients:   [employer]iler[employer]com[employer]cy  Fact Sheet for Healthcare Providers: https://pope.com/  This test is not yet approved or  cleared by the [place] FDA and has been authorized for detection and/or diagnosis of SARS-CoV-2 by FDA under an Emergency Use Authorization (EUA).  This EUA will remain in effect (meaning this test can be used) for the duration of the COVID-19 declaration under Section 564(b)(1) of the Act, 21 U.S.C. section 360bbb-3(b)(1), unless the authorization is terminated or revoked sooner.  Performed at [facility], [address] [place], [place] [address]          Radiology Studies: No results found.      Scheduled Meds: . enoxaparin (LOVENOX) injection  40 mg Subcutaneous Q24H  . QUEtiapine  12.5 mg Oral QHS   Continuous  Infusions: . sodium chloride 100 mL/hr at 06/03/20 1112     LOS: 1 day    Time spent: 30 mins     Charise Killian, MD [facility] Pager [phone]  If 7PM-7AM, please contact night-coverage [email or web address]  06/03/2020, 12:55 PM

## 2020-06-04 ENCOUNTER — Inpatient Hospital Stay
Admission: RE | Admit: 2020-06-04 | Discharge: 2020-06-06 | DRG: 885 | Disposition: A | Discharge status: Home / Self Care | Payer: No Typology Code available for payment source | Source: Intra-hospital | Attending: Psychiatry | Admitting: Psychiatry

## 2020-06-04 DIAGNOSIS — F17200 Nicotine dependence, unspecified, uncomplicated: Secondary | ICD-10-CM | POA: Diagnosis present

## 2020-06-04 DIAGNOSIS — Z915 Personal history of self-harm: Secondary | ICD-10-CM

## 2020-06-04 DIAGNOSIS — F332 Major depressive disorder, recurrent severe without psychotic features: Principal | ICD-10-CM | POA: Diagnosis present

## 2020-06-04 DIAGNOSIS — R45851 Suicidal ideations: Secondary | ICD-10-CM | POA: Diagnosis present

## 2020-06-04 DIAGNOSIS — F4310 Post-traumatic stress disorder, unspecified: Secondary | ICD-10-CM | POA: Diagnosis present

## 2020-06-04 DIAGNOSIS — R109 Unspecified abdominal pain: Secondary | ICD-10-CM | POA: Diagnosis present

## 2020-06-04 DIAGNOSIS — K219 Gastro-esophageal reflux disease without esophagitis: Secondary | ICD-10-CM | POA: Diagnosis present

## 2020-06-04 DIAGNOSIS — F1010 Alcohol abuse, uncomplicated: Secondary | ICD-10-CM | POA: Diagnosis present

## 2020-06-04 DIAGNOSIS — F1410 Cocaine abuse, uncomplicated: Secondary | ICD-10-CM | POA: Diagnosis present

## 2020-06-04 DIAGNOSIS — G4700 Insomnia, unspecified: Secondary | ICD-10-CM | POA: Diagnosis present

## 2020-06-04 LAB — COMPREHENSIVE METABOLIC PANEL
ALT: 9 U/L (ref 0–44)
AST: 10 U/L — ABNORMAL LOW (ref 15–41)
Albumin: 3.2 g/dL — ABNORMAL LOW (ref 3.5–5.0)
Alkaline Phosphatase: 37 U/L — ABNORMAL LOW (ref 38–126)
Anion gap: 6 (ref 5–15)
BUN: 6 mg/dL (ref 6–20)
CO2: 27 mmol/L (ref 22–32)
Calcium: 8.1 mg/dL — ABNORMAL LOW (ref 8.9–10.3)
Chloride: 107 mmol/L (ref 98–111)
Creatinine, Ser: 1.02 mg/dL (ref 0.61–1.24)
GFR calc Af Amer: >60 mL/min (ref >60)
GFR calc non Af Amer: >60 mL/min (ref >60)
Glucose, Bld: 93 mg/dL (ref 70–99)
Potassium: 4 mmol/L (ref 3.5–5.1)
Sodium: 140 mmol/L (ref 135–145)
Total Bilirubin: 0.9 mg/dL (ref 0.3–1.2)
Total Protein: 5.1 g/dL — ABNORMAL LOW (ref 6.5–8.1)

## 2020-06-04 LAB — CBC
HCT: 37.1 % — ABNORMAL LOW (ref 39.0–52.0)
Hemoglobin: 12.5 g/dL — ABNORMAL LOW (ref 13.0–17.0)
MCH: 33.1 pg (ref 26.0–34.0)
MCHC: 33.7 g/dL (ref 30.0–36.0)
MCV: 98.1 fL (ref 80.0–100.0)
Platelets: 254 10*3/uL (ref 150–400)
RBC: 3.78 MIL/uL — ABNORMAL LOW (ref 4.22–5.81)
RDW: 14.6 % (ref 11.5–15.5)
WBC: 5.7 10*3/uL (ref 4.0–10.5)
nRBC: 0 % (ref 0.0–0.2)

## 2020-06-04 MED ORDER — QUETIAPINE FUMARATE 25 MG PO TABS: 25.0000 mg | ORAL_TABLET | Freq: Every day | ORAL | Status: DC

## 2020-06-04 MED ORDER — ENSURE ENLIVE PO LIQD
237.0000 mL | Freq: Two times a day (BID) | ORAL | Status: DC
  Administered 2020-06-04: 237 mL via ORAL

## 2020-06-04 MED ORDER — ADULT MULTIVITAMIN W/MINERALS CH
1.0000 | ORAL_TABLET | Freq: Every day | ORAL | Status: DC
  Administered 2020-06-04: 1 via ORAL
  Filled 2020-06-04: qty 1

## 2020-06-04 MED ORDER — PANTOPRAZOLE SODIUM 40 MG PO TBEC
40.0000 mg | DELAYED_RELEASE_TABLET | Freq: Every day | ORAL | 0 refills | Status: DC
Start: 2020-06-04 — End: 2020-06-06

## 2020-06-04 NOTE — Progress Notes (Signed)
Initial Nutrition Assessment  DOCUMENTATION CODES:   Not applicable  INTERVENTION:  Ensure Enlive po BID, each supplement provides 350 kcal and 20 grams of protein  MVI with minerals daily   NUTRITION DIAGNOSIS:   Inadequate oral intake related to nausea, vomiting as evidenced by per patient/family report (per chart).    GOAL:   Patient will meet greater than or equal to 90% of their needs    MONITOR:   PO intake, Supplement acceptance, Weight trends, Labs  REASON FOR ASSESSMENT:   Malnutrition Screening Tool    ASSESSMENT:  RD working remotely.  39 year old male with past medical history significant for homelessness and polysubstance abuse admitted to [facility] on 8/12 under IVC for suicidal attempt with plan, diagnosed with severe depression with psychosis developed intermittent abdominal pain associated with nausea, vomiting, and constipation admitted from facility with worsening abdominal pain.  Per notes, possible EGD outpatient, H. Pylori ordered but waiting on stool sample. Patient likely to d/c to inpatient psych facility pending evaluation.  Patient is eating well today, per flowsheets he consumed 75% of breakfast and 100% of lunch meals today. Will order Ensure supplement to aid with meeting needs.  Current wt 199.32 lb Per encounters, pt weighed 89.8 kg (198 lb) on 05/06/20, he weighed 98.9 kg (217.58 lb) on 03/20/20, pt weighed 99.1 kg (218.02 lb) on 03/17/20, he weighed 97.5 kg (214.5 lb) on 03/16/20, and then on 02/19/20 he weighed 103 kg (227 lb). This indicates ~28 lb wt loss (12%) wt loss in the last 3 months which is significant, howevere unsure if weights were stated given ED encounters.   Medications and labs reviewed    NUTRITION - FOCUSED PHYSICAL EXAM: Unable to complete at this time, RD working remotely.  Diet Order:   Diet Order            Diet regular Room service appropriate? Yes; Fluid consistency: Thin  Diet effective now                  EDUCATION NEEDS:   Not appropriate for education at this time  Skin:  Skin Assessment: Reviewed RN Assessment  Last BM:  8/14  Height:   Ht Readings from Last 1 Encounters:  06/01/20 6' (1.829 m)    Weight:   Wt Readings from Last 1 Encounters:  06/04/20 90.6 kg   BMI:  Body mass index is 27.08 kg/m.  Estimated Nutritional Needs:   Kcal:  2300-2500  Protein:  115-125  Fluid:  >/= 2.3 L   Lars Masson, RD, LDN Clinical Nutrition After Hours/Weekend Pager # in Amion

## 2020-06-04 NOTE — Progress Notes (Addendum)
PROGRESS NOTE    Steven Khan  EXH:[id number] DOB: [birth year] DOA: 06/01/2020 PCP: Patient, No Pcp Per    Assessment & Plan:   Principal Problem:   Abdominal pain Active Problems:   Depression, major, recurrent, severe with psychosis (HCC)   Involuntary commitment   Vomiting   Intractable abdominal pain   Suicidal ideations   Abdominal pain: w/ recurrent vomiting 2.5 months. Etiology unclear, possibly secondary to acute gastritis vs drug abuse. Continue on IV protonix. Zofran prn for nausea/vomiting. Continue on IVFs. XR abd shows nonobstructed bowel gas pattern. Possible EGD as an outpatient when pt is off of cocaine and other drugs.  H. pylori ordered but waiting on stool sample  Major Depression: with psychosis. Voluntary commitment.  Psych following and recs apprec  Ilicit drug abuse: urine drug screen positive for cocaine, marijuana. Illicit drug abuse cessation counseling    DVT prophylaxis: SCDs Code Status: full  Family Communication: none listed but denies having any family or friends around to help him Disposition Plan: likely d/c to inpatient psych facility, will need to message psych for pt to be d/c to inpatient psych   Status is: Inpatient  Remains inpatient appropriate because:Unsafe d/c plan   Dispo: The patient is from: Home              Anticipated d/c is to: inpatient psych facility               Anticipated d/c date is: 1 day              Patient currently is currently medically stable     Consultants:   Psych  GI   Procedures:    Antimicrobials:   Subjective: Pt c/o nausea still   Objective: Vitals:   06/03/20 1645 06/03/20 1721 06/03/20 1953 06/04/20 0517  BP: 132/86  118/68 137/89  Pulse: 61  77 60  Resp: 17 18  16   Temp: 97.8 F (36.6 C)  99.1 F (37.3 C) 98 F (36.7 C)  TempSrc: Oral  Oral   SpO2: 100%  98% 100%  Weight:    90.6 kg  Height:        Intake/Output Summary (Last 24 hours) at 06/04/2020 0748 Last data  filed at 06/03/2020 2300 Gross per 24 hour  Intake 2029.66 ml  Output --  Net 2029.66 ml   Filed Weights   06/02/20 0415 06/03/20 0646 06/04/20 0517  Weight: 87.5 kg 87.2 kg 90.6 kg    Examination:  General exam: Appears agitated  Respiratory system: clear breath sounds b/l. No wheezes, rales or rhonchi  Cardiovascular system: S1 & S2 +. No rubs, gallops or clicks.  Gastrointestinal system: Abdomen is nondistended, soft and nontender. Hypoactive bowel sounds heard. Central nervous system: Alert and oriented. Moves all 4 extremities Psychiatry: Judgement and insight appear normal. Agitated & frustrated    Data Reviewed: I have personally reviewed following labs and imaging studies  CBC: Recent Labs  Lab 05/30/20 1208 06/02/20 0201 06/03/20 0631 06/04/20 0614  WBC 8.1 7.4 7.0 5.7  HGB 15.4 14.7 14.0 12.5*  HCT 44.5 40.6 39.2 37.1*  MCV 94.9 91.2 92.9 98.1  PLT 293 296 258 254   Basic Metabolic Panel: Recent Labs  Lab 05/30/20 1208 06/02/20 0201 06/03/20 0631 06/04/20 0614  NA 137 137 137 140  K 4.0 3.7 4.2 4.0  CL 100 103 103 107  CO2 25 25 26 27   GLUCOSE 81 100* 85 93  BUN 20 12 9  6  CREATININE 1.10 0.97 1.14 1.02  CALCIUM 9.1 8.5* 8.4* 8.1*   GFR: Estimated Creatinine Clearance: 107.8 mL/min (by C-G formula based on SCr of 1.02 mg/dL). Liver Function Tests: Recent Labs  Lab 05/30/20 1208 06/01/20 0724 06/02/20 0201 06/03/20 0631 06/04/20 0614  AST 19 16 15  12* 10*  ALT 17 13 12 11 9   ALKPHOS 50 45 40 39 37*  BILITOT 2.7* 2.0* 2.1* 1.7* 0.9  PROT 7.5 7.0 6.5 5.7* 5.1*  ALBUMIN 4.6 4.2 3.9 3.4* 3.2*   Recent Labs  Lab 05/30/20 1210 06/02/20 0201  LIPASE 21 23   No results for input(s): AMMONIA in the last 168 hours. Coagulation Profile: No results for input(s): INR, PROTIME in the last 168 hours. Cardiac Enzymes: No results for input(s): CKTOTAL, CKMB, CKMBINDEX, TROPONINI in the last 168 hours. BNP (last 3 results) No results for  input(s): PROBNP in the last 8760 hours. HbA1C: No results for input(s): HGBA1C in the last 72 hours. CBG: No results for input(s): GLUCAP in the last 168 hours. Lipid Profile: No results for input(s): CHOL, HDL, LDLCALC, TRIG, CHOLHDL, LDLDIRECT in the last 72 hours. Thyroid Function Tests: No results for input(s): TSH, T4TOTAL, FREET4, T3FREE, THYROIDAB in the last 72 hours. Anemia Panel: No results for input(s): VITAMINB12, FOLATE, FERRITIN, TIBC, IRON, RETICCTPCT in the last 72 hours. Sepsis Labs: No results for input(s): PROCALCITON, LATICACIDVEN in the last 168 hours.  Recent Results (from the past 240 hour(s))  SARS Coronavirus 2 by RT PCR (hospital order, performed in [facility] hospital lab) Nasopharyngeal Nasopharyngeal Swab     Status: None   Collection Time: 05/30/20  4:11 PM   Specimen: Nasopharyngeal Swab  Result Value Ref Range Status   SARS Coronavirus 2 NEGATIVE NEGATIVE Final    Comment: (NOTE) SARS-CoV-2 target nucleic acids are NOT DETECTED.  The SARS-CoV-2 RNA is generally detectable in upper and lower respiratory specimens during the acute phase of infection. The lowest concentration of SARS-CoV-2 viral copies this assay can detect is 250 copies / mL. A negative result does not preclude SARS-CoV-2 infection and should not be used as the sole basis for treatment or other patient management decisions.  A negative result may occur with improper specimen collection / handling, submission of specimen other than nasopharyngeal swab, presence of viral mutation(s) within the areas targeted by this assay, and inadequate number of viral copies (<250 copies / mL). A negative result must be combined with clinical observations, patient history, and epidemiological information.  Fact Sheet for Patients:   [facility]  Fact Sheet for Healthcare Providers: 07/30/20  This test is not yet approved or  cleared  by the [employer] FDA and has been authorized for detection and/or diagnosis of SARS-CoV-2 by FDA under an Emergency Use Authorization (EUA).  This EUA will remain in effect (meaning this test can be used) for the duration of the COVID-19 declaration under Section 564(b)(1) of the Act, 21 U.S.C. section 360bbb-3(b)(1), unless the authorization is terminated or revoked sooner.  Performed at [facility], [address], [place], [address] [place]          Radiology Studies: No results found.      Scheduled Meds: . enoxaparin (LOVENOX) injection  40 mg Subcutaneous Q24H  . QUEtiapine  25 mg Oral QHS   Continuous Infusions: . sodium chloride 100 mL/hr at 06/03/20 2156     LOS: 2 days    Time spent: 31 mins     06/05/20, MD [facility]  Pager [phone]  If 7PM-7AM, please contact night-coverage [email or web address]  06/04/2020, 7:48 AM

## 2020-06-04 NOTE — Discharge Summary (Signed)
Physician Discharge Summary  Steven Khan CNO:[id number] DOB: Mar 14, 1981 DOA: 06/01/2020  PCP: Patient, No Pcp Per  Admit date: 06/01/2020 Discharge date: 06/04/2020  Admitted From: pt is homeless Disposition: inpatient psych facility   Recommendations for Outpatient Follow-up:  1. Follow up with PCP in 1-2 weeks 2. F/u GI, Dr. Tobi Bastos, in 1-2 weeks 3. F.u psych ASAP  Home Health: no Equipment/Devices:  Discharge Condition: stable CODE STATUS: full  Diet recommendation: regular   Brief/Interim Summary: HPI was taken from Dr. Para March: Steven Khan is a 39 y.o. male with medical history significant for homelessness and polysubstance abuse  admitted to behavioral health on 05/30/2020 under IVC, for suicidal attempt with plan, diagnosed with severe depression with psychosis, who developed intermittent abdominal pain associated with nausea on the a.m. of 06/01/2020, associated with constipation.  Patient's pain is in the epigastric and right upper quadrant, 10 out of 10, crampy, no aggravating or alleviating factors.  He was treated empirically with milk of mag and prune juice without improvement.  He continued to have abdominal pain as the day progressed and started vomiting.  Was unable to eat his lunch and dinner.  His symptoms suddenly worsened at around 7 PM and a rapid response was called.  EKG showed no acute ST-T wave changes.  Hospitalist consulted for admission to the medical service.  Patient at the emergency room at [facility] couple weeks prior and had a scan and discharge.  Not found in Care Everywhere.  Hospital Course from Dr. Wilfred Lacy 8/15-8/17/21: Pt presented w/ abd pain w/ recurrent nausea & vomiting x 2.5 months. Etiology unclear, possible gastritis vs drug abuse. GI was consulted and recommended that pt abstain for all drugs and a potential EGD could be done as an outpatient. H. Pylori was ordered but no stool was able to collected. Pt can do this test as an outpatient  as well. Furthermore, pt also presented w/ suicidal ideations & severe depression. Pt agree for voluntary commitment. Pt was accepted by the inpatient psych facility in basement of [facility]. For more information, please see previous progress notes.    Discharge Diagnoses:  Principal Problem:   Abdominal pain Active Problems:   Depression, major, recurrent, severe with psychosis (HCC)   Involuntary commitment   Vomiting   Intractable abdominal pain   Suicidal ideations  Abdominal pain: w/ recurrent nausea & vomiting 2.5 months. Etiology unclear, possibly secondary to acute gastritis vs drug abuse. Continue on protonix. Zofran prn for nausea/vomiting. Continue on IVFs while inpatient only. XR abd shows nonobstructed bowel gas pattern. Possible EGD as an outpatient when pt is off of cocaine and other drugs.  H. pylori ordered but can be done as an outpatient if pt is unable to give a stool sample   Major Depression: with psychosis. Voluntary commitment.  Psych following and recs apprec  Ilicit drug abuse: urine drug screen positive for cocaine, marijuana. Illicit drug abuse cessation counseling   Discharge Instructions  Discharge Instructions    Diet general   Complete by: As directed    Discharge instructions   Complete by: As directed    F/u psychiatrist ASAP. F/u PCP in 1-2 weeks. F/u GI, Dr. Tobi Bastos, in 1-2 weeks   Increase activity slowly   Complete by: As directed      Allergies as of 06/04/2020   No Known Allergies     Medication List    TAKE these medications   pantoprazole 40 MG tablet Commonly known as: Protonix Take 1  tablet (40 mg total) by mouth daily.   promethazine 25 MG tablet Commonly known as: PHENERGAN Take 25 mg by mouth every 6 (six) hours as needed for nausea or vomiting.   QUEtiapine 25 MG tablet Commonly known as: SEROQUEL Take 1 tablet (25 mg total) by mouth at bedtime.       No Known Allergies  Consultations:  GI   Psych     Procedures/Studies: DG Abd 1 View  Result Date: 06/01/2020 CLINICAL DATA:  Generalized mid abdominal pain. EXAM: ABDOMEN - 1 VIEW COMPARISON:  None. FINDINGS: Lung bases are clear. Gas is demonstrated within nondilated loops of large and small bowel in a nonobstructed pattern. Osseous structures unremarkable. Phleboliths left hemipelvis. IMPRESSION: Nonobstructed bowel gas pattern.  Nonobstructed bowel gas pattern. Electronically Signed   By: Annia Belt M.D.   On: 06/01/2020 15:30     Subjective: Pt c/o nausea    Discharge Exam: Vitals:   06/04/20 1134 06/04/20 1537  BP: 116/71   Pulse: 68   Resp: 19 18  Temp: 98 F (36.7 C)   SpO2: 99%    Vitals:   06/04/20 0857 06/04/20 1016 06/04/20 1134 06/04/20 1537  BP:   116/71   Pulse:   68   Resp: 18 18 19 18   Temp:   98 F (36.7 C)   TempSrc:   Oral   SpO2:   99%   Weight:      Height:        General exam:Appears agitated Respiratory system: clear breath sounds b/l. No wheezes, rales or rhonchi  Cardiovascular system:S1 &S2 +. No rubs, gallops or clicks.  Gastrointestinal system:Abdomen is nondistended, soft and nontender. Hypoactive bowel sounds heard. Central nervous system:Alert and oriented. Moves all 4 extremities Psychiatry:Judgement and insight appear normal.Agitated & frustrated    The results of significant diagnostics from this hospitalization (including imaging, microbiology, ancillary and laboratory) are listed below for reference.     Microbiology: Recent Results (from the past 240 hour(s))  SARS Coronavirus 2 by RT PCR (hospital order, performed in [facility] hospital lab) Nasopharyngeal Nasopharyngeal Swab     Status: None   Collection Time: 05/30/20  4:11 PM   Specimen: Nasopharyngeal Swab  Result Value Ref Range Status   SARS Coronavirus 2 NEGATIVE NEGATIVE Final    Comment: (NOTE) SARS-CoV-2 target nucleic acids are NOT DETECTED.  The SARS-CoV-2 RNA is generally detectable in upper and  lower respiratory specimens during the acute phase of infection. The lowest concentration of SARS-CoV-2 viral copies this assay can detect is 250 copies / mL. A negative result does not preclude SARS-CoV-2 infection and should not be used as the sole basis for treatment or other patient management decisions.  A negative result may occur with improper specimen collection / handling, submission of specimen other than nasopharyngeal swab, presence of viral mutation(s) within the areas targeted by this assay, and inadequate number of viral copies (<250 copies / mL). A negative result must be combined with clinical observations, patient history, and epidemiological information.  Fact Sheet for Patients:   07/30/20  Fact Sheet for Healthcare Providers: BoilerBrush.com.cy  This test is not yet approved or  cleared by the https://pope.com/ FDA and has been authorized for detection and/or diagnosis of SARS-CoV-2 by FDA under an Emergency Use Authorization (EUA).  This EUA will remain in effect (meaning this test can be used) for the duration of the COVID-19 declaration under Section 564(b)(1) of the Act, 21 U.S.C. section 360bbb-3(b)(1), unless the authorization  is terminated or revoked sooner.  Performed at [facility], [address] [place], [place] [address]      Labs: BNP (last 3 results) No results for input(s): BNP in the last 8760 hours. Basic Metabolic Panel: Recent Labs  Lab 05/30/20 1208 06/02/20 0201 06/03/20 0631 06/04/20 0614  NA 137 137 137 140  K 4.0 3.7 4.2 4.0  CL 100 103 103 107  CO2 25 25 26 27   GLUCOSE 81 100* 85 93  BUN 20 12 9 6   CREATININE 1.10 0.97 1.14 1.02  CALCIUM 9.1 8.5* 8.4* 8.1*   Liver Function Tests: Recent Labs  Lab 05/30/20 1208 06/01/20 0724 06/02/20 0201 06/03/20 0631 06/04/20 0614  AST 19 16 15  12* 10*  ALT 17 13 12 11 9   ALKPHOS 50 45 40 39 37*  BILITOT 2.7* 2.0*  2.1* 1.7* 0.9  PROT 7.5 7.0 6.5 5.7* 5.1*  ALBUMIN 4.6 4.2 3.9 3.4* 3.2*   Recent Labs  Lab 05/30/20 1210 06/02/20 0201  LIPASE 21 23   No results for input(s): AMMONIA in the last 168 hours. CBC: Recent Labs  Lab 05/30/20 1208 06/02/20 0201 06/03/20 0631 06/04/20 0614  WBC 8.1 7.4 7.0 5.7  HGB 15.4 14.7 14.0 12.5*  HCT 44.5 40.6 39.2 37.1*  MCV 94.9 91.2 92.9 98.1  PLT 293 296 258 254   Cardiac Enzymes: No results for input(s): CKTOTAL, CKMB, CKMBINDEX, TROPONINI in the last 168 hours. BNP: Invalid input(s): POCBNP CBG: No results for input(s): GLUCAP in the last 168 hours. D-Dimer No results for input(s): DDIMER in the last 72 hours. Hgb A1c No results for input(s): HGBA1C in the last 72 hours. Lipid Profile No results for input(s): CHOL, HDL, LDLCALC, TRIG, CHOLHDL, LDLDIRECT in the last 72 hours. Thyroid function studies No results for input(s): TSH, T4TOTAL, T3FREE, THYROIDAB in the last 72 hours.  Invalid input(s): FREET3 Anemia work up No results for input(s): VITAMINB12, FOLATE, FERRITIN, TIBC, IRON, RETICCTPCT in the last 72 hours. Urinalysis    Component Value Date/Time   COLORURINE AMBER (A) 05/30/2020 1208   APPEARANCEUR HAZY (A) 05/30/2020 1208   LABSPEC 1.028 05/30/2020 1208   PHURINE 5.0 05/30/2020 1208   GLUCOSEU NEGATIVE 05/30/2020 1208   HGBUR NEGATIVE 05/30/2020 1208   BILIRUBINUR NEGATIVE 05/30/2020 1208   KETONESUR 80 (A) 05/30/2020 1208   PROTEINUR 30 (A) 05/30/2020 1208   NITRITE NEGATIVE 05/30/2020 1208   LEUKOCYTESUR NEGATIVE 05/30/2020 1208   Sepsis Labs Invalid input(s): PROCALCITONIN,  WBC,  LACTICIDVEN Microbiology Recent Results (from the past 240 hour(s))  SARS Coronavirus 2 by RT PCR (hospital order, performed in [facility] hospital lab) Nasopharyngeal Nasopharyngeal Swab     Status: None   Collection Time: 05/30/20  4:11 PM   Specimen: Nasopharyngeal Swab  Result Value Ref Range Status   SARS Coronavirus 2 NEGATIVE  NEGATIVE Final    Comment: (NOTE) SARS-CoV-2 target nucleic acids are NOT DETECTED.  The SARS-CoV-2 RNA is generally detectable in upper and lower respiratory specimens during the acute phase of infection. The lowest concentration of SARS-CoV-2 viral copies this assay can detect is 250 copies / mL. A negative result does not preclude SARS-CoV-2 infection and should not be used as the sole basis for treatment or other patient management decisions.  A negative result may occur with improper specimen collection / handling, submission of specimen other than nasopharyngeal swab, presence of viral mutation(s) within the areas targeted by this assay, and inadequate number of viral copies (<250 copies /  mL). A negative result must be combined with clinical observations, patient history, and epidemiological information.  Fact Sheet for Patients:   BoilerBrush.com.cy  Fact Sheet for Healthcare Providers: https://pope.com/  This test is not yet approved or  cleared by the [place] FDA and has been authorized for detection and/or diagnosis of SARS-CoV-2 by FDA under an Emergency Use Authorization (EUA).  This EUA will remain in effect (meaning this test can be used) for the duration of the COVID-19 declaration under Section 564(b)(1) of the Act, 21 U.S.C. section 360bbb-3(b)(1), unless the authorization is terminated or revoked sooner.  Performed at [facility], [address] [place], [place] [address]      Time coordinating discharge: Over 30 minutes  SIGNED:   Charise Killian, MD  [facility] 06/04/2020, 4:13 PM Pager   If 7PM-7AM, please contact night-coverage [email or web address]

## 2020-06-04 NOTE — BH Assessment (Signed)
Patient can come down after 9pm  Call to give report: [phone]  Patient is to be admitted to [facility] by Dr. Toni Amend.  Attending Physician will be. Dr. Toni Amend.   Patient has been assigned to room 319, by [facility] Charge Nurse Ivonne Andrew, RN.   Intake Paper Work has been signed and placed on patient chart.  Medical floor staff is aware of the admission: 1. Russoli, Case manager  2. Fabienne Bruns, MD  3. Karalee Height, Patient's Nurse  4. Ethelene Browns, Patient Access.  TTS attempted to contact the medical floor secretary at [phone] received no answer

## 2020-06-04 NOTE — Plan of Care (Signed)
?  Problem: Clinical Measurements: ?Goal: Will remain free from infection ?Outcome: Progressing ?  ?Problem: Nutrition: ?Goal: Adequate nutrition will be maintained ?Outcome: Progressing ?  ?Problem: Pain Managment: ?Goal: General experience of comfort will improve ?Outcome: Progressing ?  ?

## 2020-06-04 NOTE — Plan of Care (Signed)
  Problem: Education: Goal: Knowledge of General Education information will improve Description Including pain rating scale, medication(s)/side effects and non-pharmacologic comfort measures Outcome: Progressing   Problem: Health Behavior/Discharge Planning: Goal: Ability to manage health-related needs will improve Outcome: Progressing   

## 2020-06-04 NOTE — Final Progress Note (Signed)
Physician Final Progress Note  Patient ID: Steven Khan MRN: [id number] DOB/AGE: 04-25-1981 39 y.o.  Admit date: 06/01/2020 Admitting provider: Charise Killian, MD Discharge date: 06/04/2020   Admission Diagnoses:  --Major depression severe recurrent   Substance dependence Adjustment disorder   Discharge Diagnoses:  Principal Problem:   Abdominal pain Active Problems:   Depression, major, recurrent, severe with psychosis (HCC)   Involuntary commitment   Vomiting   Intractable abdominal pain   Suicidal ideations  '  Patient was admitted to Psych and changed to voluntary status for Depression adjustment problems and dual diagnosis issues   He was transferred to medicine due to abdominal pain and so he is now medically clear pending possible outpatient endoscopy followup   He elects to go back to Psych hopefully to our unit today or otherwise we have to refer him out to another facility    MS   Cooperative oriented times four Consciousness not clouded or fluctuant Mood and affect depressed and constricted Concentration and attention okay Speech normal tone, volume fluency  Thought process and content _no psychosis but has passive SI no active plans and also  Has depressive themes  Memory remote recent and immediate okay Judgement insight reliability fair Movements no shakes tremors or tics Akathisia none Recall okay ADL's okay Cognition okay Musculoskeletal/gait /station normal Leans --n/a Handedness not known  Si and HI--no HI    Passive Si Sleep improving  Intelligence fund of knowledge okay  Abstraction normal  Rapport okay  Eye contact normal Appearance okay          Same   Consults:   IM Psych ER and consult GI ----TTS  Significant Findings/ Diagnostic Studies:   Possible pending Outpatient endoscopy and workup --currently medically clear     Procedures:  General workup from IM   Discharge Condition:  oka stable  Transferring back  to psychiatry   Disposition:  transfer back to psych unit  Voluntary status   Diet:  As tolerated   Discharge Activity:   Per unit in psych        Total time spent taking care of this patient:30 - 40  minutes  Signed: Roselind Messier 06/04/2020, 3:30 PM

## 2020-06-05 ENCOUNTER — Encounter: Payer: Self-pay | Admitting: Psychiatry

## 2020-06-05 DIAGNOSIS — F332 Major depressive disorder, recurrent severe without psychotic features: Secondary | ICD-10-CM | POA: Diagnosis not present

## 2020-06-05 MED ORDER — POLYETHYLENE GLYCOL 3350 17 G PO PACK
17.0000 g | PACK | Freq: Every day | ORAL | Status: DC
  Administered 2020-06-05 – 2020-06-06 (×2): 17 g via ORAL
  Filled 2020-06-05 (×2): qty 1

## 2020-06-05 MED ORDER — PANTOPRAZOLE SODIUM 40 MG PO TBEC
40.0000 mg | DELAYED_RELEASE_TABLET | Freq: Two times a day (BID) | ORAL | Status: DC
  Administered 2020-06-05 – 2020-06-06 (×2): 40 mg via ORAL
  Filled 2020-06-05 (×2): qty 1

## 2020-06-05 MED ORDER — PROMETHAZINE HCL 25 MG PO TABS: 12.5000 mg | ORAL_TABLET | Freq: Four times a day (QID) | ORAL | Status: DC | PRN

## 2020-06-05 MED ORDER — DOCUSATE SODIUM 100 MG PO CAPS
200.0000 mg | ORAL_CAPSULE | Freq: Two times a day (BID) | ORAL | Status: DC
  Administered 2020-06-05 – 2020-06-06 (×2): 200 mg via ORAL
  Filled 2020-06-05 (×2): qty 2

## 2020-06-05 MED ORDER — ACETAMINOPHEN 325 MG PO TABS: 650.0000 mg | ORAL_TABLET | Freq: Four times a day (QID) | ORAL | Status: DC | PRN

## 2020-06-05 MED ORDER — MIRTAZAPINE 15 MG PO TABS
15.0000 mg | ORAL_TABLET | Freq: Every day | ORAL | Status: DC
  Administered 2020-06-05: 15 mg via ORAL
  Filled 2020-06-05: qty 1

## 2020-06-05 MED ORDER — MAGNESIUM HYDROXIDE 400 MG/5ML PO SUSP: 30.0000 mL | Freq: Every day | ORAL | Status: DC | PRN

## 2020-06-05 MED ORDER — ADULT MULTIVITAMIN W/MINERALS CH
1.0000 | ORAL_TABLET | Freq: Every day | ORAL | Status: DC
  Administered 2020-06-06: 1 via ORAL
  Filled 2020-06-05: qty 1

## 2020-06-05 MED ORDER — ENSURE ENLIVE PO LIQD
237.0000 mL | Freq: Three times a day (TID) | ORAL | Status: DC
  Administered 2020-06-05: 237 mL via ORAL

## 2020-06-05 MED ORDER — DOCUSATE SODIUM 100 MG PO CAPS: 100.0000 mg | ORAL_CAPSULE | Freq: Two times a day (BID) | ORAL | Status: DC

## 2020-06-05 MED ORDER — ALUM & MAG HYDROXIDE-SIMETH 200-200-20 MG/5ML PO SUSP: 30.0000 mL | ORAL | Status: DC | PRN

## 2020-06-05 MED ORDER — ENSURE ENLIVE PO LIQD: 237.0000 mL | Freq: Three times a day (TID) | ORAL | Status: DC

## 2020-06-05 NOTE — Plan of Care (Addendum)
Pt rates depression, anxiety and hopelessness all at 9/10. Pt denies HI and VH.  Pt has SI without a plan and verbal contracted for safety. Pt stated he has AH some times. Pt was educated on care plan and verbalizes understanding. Pt was encouraged to attend groups. Torrie Mayers RN Problem: Education: Goal: Knowledge of [facility] Education information/materials will improve Outcome: Progressing Goal: Emotional status will improve Outcome: Progressing Goal: Mental status will improve Outcome: Progressing Goal: Verbalization of understanding the information provided will improve Outcome: Progressing   Problem: Activity: Goal: Interest or engagement in activities will improve Outcome: Progressing Goal: Sleeping patterns will improve Outcome: Progressing   Problem: Coping: Goal: Ability to verbalize frustrations and anger appropriately will improve Outcome: Progressing Goal: Ability to demonstrate self-control will improve Outcome: Progressing   Problem: Health Behavior/Discharge Planning: Goal: Identification of resources available to assist in meeting health care needs will improve Outcome: Progressing Goal: Compliance with treatment plan for underlying cause of condition will improve Outcome: Progressing   Problem: Physical Regulation: Goal: Ability to maintain clinical measurements within normal limits will improve Outcome: Progressing   Problem: Safety: Goal: Periods of time without injury will increase Outcome: Progressing   Problem: Education: Goal: Ability to make informed decisions regarding treatment will improve Outcome: Progressing   Problem: Coping: Goal: Coping ability will improve Outcome: Progressing   Problem: Health Behavior/Discharge Planning: Goal: Identification of resources available to assist in meeting health care needs will improve Outcome: Progressing   Problem: Medication: Goal: Compliance with prescribed medication regimen will  improve Outcome: Progressing   Problem: Self-Concept: Goal: Ability to disclose and discuss suicidal ideas will improve Outcome: Progressing Goal: Will verbalize positive feelings about self Outcome: Progressing   Problem: Education: Goal: Knowledge of disease or condition will improve Outcome: Progressing Goal: Understanding of discharge needs will improve Outcome: Progressing   Problem: Health Behavior/Discharge Planning: Goal: Ability to identify changes in lifestyle to reduce recurrence of condition will improve Outcome: Progressing Goal: Identification of resources available to assist in meeting health care needs will improve Outcome: Progressing   Problem: Physical Regulation: Goal: Complications related to the disease process, condition or treatment will be avoided or minimized Outcome: Progressing   Problem: Safety: Goal: Ability to remain free from injury will improve Outcome: Progressing

## 2020-06-05 NOTE — [facility] Suicide Risk Assessment (Signed)
[facility] INPATIENT:  Family/Significant Other Suicide Prevention Education  Suicide Prevention Education:  Patient Refusal for Family/Significant Other Suicide Prevention Education: The patient Steven Khan has refused to provide written consent for family/significant other to be provided Family/Significant Other Suicide Prevention Education during admission and/or prior to discharge.  Physician notified.  Chais Fehringer T Nimah Uphoff 06/05/2020, 1:44 PM

## 2020-06-05 NOTE — Discharge Summary (Signed)
Physician Discharge Summary Note  Patient:  Steven Khan is an 39 y.o., male MRN:  [id number] DOB:  18-Oct-1981 Patient phone:  [phone] (home)  Patient address:   [address] [place] [facility] [address],  Total Time spent with patient: 30 minutes  Date of Admission:  05/31/2020 Date of Discharge: 06/01/20  Reason for Admission: Patient admitted to the psychiatric service because of depression anxiety and suicidal ideation  Principal Problem: Depression, major, recurrent, severe with psychosis (HCC) Discharge Diagnoses: Principal Problem:   Depression, major, recurrent, severe with psychosis (HCC) Active Problems:   PTSD (post-traumatic stress disorder)   Alcohol abuse   Cocaine abuse (HCC)   Abdominal pain   Past Psychiatric History: History of depression treated in prison  Past Medical History: History reviewed. No pertinent past medical history.  Past Surgical History:  Procedure Laterality Date   HERNIA REPAIR     Family History: History reviewed. No pertinent family history. Family Psychiatric  History: None reported Social History:  Social History   Substance and Sexual Activity  Alcohol Use Yes     Social History   Substance and Sexual Activity  Drug Use Yes   Types: Cocaine, Marijuana   Comment: ecstasy    Social History   Socioeconomic History   Marital status: Divorced    Spouse name: Not on file   Number of children: Not on file   Years of education: Not on file   Highest education level: Not on file  Occupational History   Not on file  Tobacco Use   Smoking status: Current Every Day Smoker   Smokeless tobacco: Never Used  Substance and Sexual Activity   Alcohol use: Yes   Drug use: Yes    Types: Cocaine, Marijuana    Comment: ecstasy   Sexual activity: Not on file  Other Topics Concern   Not on file  Social History Narrative   Not on file   Social Determinants of Health   Financial Resource Strain:    Difficulty of Paying  Living Expenses:   Food Insecurity:    Worried About Programme researcher, broadcasting/film/video in the Last Year:    Barista in the Last Year:   Transportation Needs:    Freight forwarder (Medical):    Lack of Transportation (Non-Medical):   Physical Activity:    Days of Exercise per Week:    Minutes of Exercise per Session:   Stress:    Feeling of Stress :   Social Connections:    Frequency of Communication with Friends and Family:    Frequency of Social Gatherings with Friends and Family:    Attends Religious Services:    Active Member of Clubs or Organizations:    Attends Banker Meetings:    Marital Status:     Hospital Course: Admitted to psychiatric ward.  15-minute checks maintained.  Medication management continued.  Patient continued to complain of abdominal pain during the day.  Some treatment was initiated but he continued to have worsening pain that evening including reports of radiation to his chest.  Rapid response was called and the decision was made to transfer him to medical for further evaluation of chest pain  Physical Findings: AIMS:  , ,  ,  ,    CIWA:    COWS:     Musculoskeletal: Strength & Muscle Tone: within normal limits Gait & Station: normal Patient leans: N/A  Psychiatric Specialty Exam: Physical Exam Vitals and nursing note reviewed.  Constitutional:  Appearance: He is well-developed.  HENT:     Head: Normocephalic and atraumatic.  Eyes:     Conjunctiva/sclera: Conjunctivae normal.     Pupils: Pupils are equal, round, and reactive to light.  Cardiovascular:     Heart sounds: Normal heart sounds.  Pulmonary:     Effort: Pulmonary effort is normal.  Abdominal:     Palpations: Abdomen is soft.  Musculoskeletal:        General: Normal range of motion.     Cervical back: Normal range of motion.  Skin:    General: Skin is warm and dry.  Neurological:     General: No focal deficit present.     Mental Status: He is  alert.  Psychiatric:        Mood and Affect: Mood is depressed.     Review of Systems  Constitutional: Negative.   HENT: Negative.   Eyes: Negative.   Respiratory: Negative.   Cardiovascular: Negative.   Gastrointestinal: Negative.   Musculoskeletal: Negative.   Skin: Negative.   Neurological: Negative.   Psychiatric/Behavioral: Positive for dysphoric mood and suicidal ideas.    Blood pressure (!) 143/77, pulse 71, temperature 98.4 F (36.9 C), temperature source Oral, resp. rate 18, height 6' (1.829 m), weight 85.3 kg, SpO2 99 %.Body mass index is 25.5 kg/m.  General Appearance: Casual  Eye Contact:  Good  Speech:  Clear and Coherent  Volume:  Normal  Mood:  Euthymic  Affect:  Congruent  Thought Process:  Goal Directed  Orientation:  Full (Time, Place, and Person)  Thought Content:  Logical  Suicidal Thoughts:  No  Homicidal Thoughts:  No  Memory:  Immediate;   Fair Recent;   Fair Remote;   Fair  Judgement:  Fair  Insight:  Fair  Psychomotor Activity:  Decreased  Concentration:  Concentration: Fair  Recall:  Fair  Fund of Knowledge:  Fair  Language:  Fair  Akathisia:  No  Handed:  Right  AIMS (if indicated):     Assets:  Desire for Improvement  ADL's:  Intact  Cognition:  WNL  Sleep:  Number of Hours: 6.75     Have you used any form of tobacco in the last 30 days? (Cigarettes, Smokeless Tobacco, Cigars, and/or Pipes): Yes  Has this patient used any form of tobacco in the last 30 days? (Cigarettes, Smokeless Tobacco, Cigars, and/or Pipes) Yes, No  Blood Alcohol level:  Lab Results  Component Value Date   ETH <10 05/30/2020    Metabolic Disorder Labs:  No results found for: HGBA1C, MPG No results found for: PROLACTIN Lab Results  Component Value Date   CHOL 206 (H) 06/01/2020   TRIG 70 06/01/2020   HDL 57 06/01/2020   CHOLHDL 3.6 06/01/2020   VLDL 14 06/01/2020   LDLCALC 135 (H) 06/01/2020    See Psychiatric Specialty Exam and Suicide Risk  Assessment completed by Attending Physician prior to discharge.  Discharge destination:  Other:  Patient being transferred to the medical service  Is patient on multiple antipsychotic therapies at discharge:  No   Has Patient had three or more failed trials of antipsychotic monotherapy by history:  No  Recommended Plan for Multiple Antipsychotic Therapies: NA   Allergies as of 06/01/2020   No Known Allergies     Medication List    ASK your doctor about these medications     Indication  promethazine 25 MG tablet Commonly known as: PHENERGAN Take 25 mg by mouth every 6 (six) hours as  needed for nausea or vomiting.         Follow-up recommendations:  Activity:  As directed by medicine Diet:  As directed by medicine Other:  Follow-up psychiatric treatment as needed  Comments: This is a dictation of a discharge summary for his transfer to medicine on the 14th  Signed: Mordecai Rasmussen, MD 06/05/2020, 5:59 PM
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
## 2020-06-05 NOTE — Plan of Care (Signed)
°  Problem: Education: Goal: Knowledge of  General Education information/materials will improve Outcome: Progressing Goal: Emotional status will improve Outcome: Progressing Goal: Mental status will improve Outcome: Progressing Goal: Verbalization of understanding the information provided will improve Outcome: Progressing   Problem: Activity: Goal: Interest or engagement in activities will improve Outcome: Progressing Goal: Sleeping patterns will improve Outcome: Progressing   Problem: Coping: Goal: Ability to verbalize frustrations and anger appropriately will improve Outcome: Progressing Goal: Ability to demonstrate self-control will improve Outcome: Progressing   Problem: Health Behavior/Discharge Planning: Goal: Identification of resources available to assist in meeting health care needs will improve Outcome: Progressing Goal: Compliance with treatment plan for underlying cause of condition will improve Outcome: Progressing   Problem: Physical Regulation: Goal: Ability to maintain clinical measurements within normal limits will improve Outcome: Progressing   Problem: Safety: Goal: Periods of time without injury will increase Outcome: Progressing   Problem: Education: Goal: Ability to make informed decisions regarding treatment will improve Outcome: Progressing   Problem: Coping: Goal: Coping ability will improve Outcome: Progressing   Problem: Health Behavior/Discharge Planning: Goal: Identification of resources available to assist in meeting health care needs will improve Outcome: Progressing   Problem: Medication: Goal: Compliance with prescribed medication regimen will improve Outcome: Progressing   Problem: Self-Concept: Goal: Ability to disclose and discuss suicidal ideas will improve Outcome: Progressing Goal: Will verbalize positive feelings about self Outcome: Progressing   Problem: Education: Goal: Knowledge of disease or condition  will improve Outcome: Progressing Goal: Understanding of discharge needs will improve Outcome: Progressing   Problem: Health Behavior/Discharge Planning: Goal: Ability to identify changes in lifestyle to reduce recurrence of condition will improve Outcome: Progressing Goal: Identification of resources available to assist in meeting health care needs will improve Outcome: Progressing   Problem: Physical Regulation: Goal: Complications related to the disease process, condition or treatment will be avoided or minimized Outcome: Progressing   Problem: Safety: Goal: Ability to remain free from injury will improve Outcome: Progressing   

## 2020-06-05 NOTE — Progress Notes (Signed)
D- Patient alert and oriented. Affect/mood is pleasant. Pt denies SI, HI, AVH, and pain. Pt has been social and went to groups.   A- Scheduled medications administered to patient, per MD orders. Support and encouragement provided.  Routine safety checks conducted every 15 minutes.  Patient informed to notify staff with problems or concerns.  R- No adverse drug reactions noted. Patient contracts for safety at this time. Patient compliant with medications and treatment plan. Patient receptive, calm, and cooperative. Patient interacts well with others on the unit.  Patient remains safe at this time.  Jaqulyn Chancellor RN 
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
## 2020-06-05 NOTE — Plan of Care (Signed)
Patient pleasant but presents with depression/anxiety   Problem: Education: Goal: Emotional status will improve Outcome: Not Progressing Goal: Mental status will improve Outcome: Not Progressing

## 2020-06-05 NOTE — BHH Suicide Risk Assessment (Signed)
[facility] Admission Suicide Risk Assessment   Nursing information obtained from:  Patient Demographic factors:  Male, Divorced or widowed, Unemployed Current Mental Status:  NA Loss Factors:  Legal issues, Financial problems / change in socioeconomic status Historical Factors:  Prior suicide attempts Risk Reduction Factors:  NA  Total Time spent with patient: 1 hour Principal Problem: Severe recurrent major depression without psychotic features (HCC) Diagnosis:  Principal Problem:   Severe recurrent major depression without psychotic features (HCC) Active Problems:   PTSD (post-traumatic stress disorder)   Alcohol abuse   Cocaine abuse (HCC)   Abdominal pain  Subjective Data: Patient seen and chart reviewed.  This is a 39 year old man who had presented initially with depression anxiety and suicidal ideation.  After a stay on the medical service he continues to endorse all of the symptoms saying he still has suicidal thoughts.  Not reporting psychosis.  Feels generally hopeless and helpless.  Continues to focus on physical problems including ongoing cramping abdominal pain of unclear etiology  Continued Clinical Symptoms:  Alcohol Use Disorder Identification Test Final Score (AUDIT): 4 The "Alcohol Use Disorders Identification Test", Guidelines for Use in Primary Care, Second Edition.  World Science writer [facility]). Score between 0-7:  no or low risk or alcohol related problems. Score between 8-15:  moderate risk of alcohol related problems. Score between 16-19:  high risk of alcohol related problems. Score 20 or above:  warrants further diagnostic evaluation for alcohol dependence and treatment.   CLINICAL FACTORS:   Depression:   Comorbid alcohol abuse/dependence   Musculoskeletal: Strength & Muscle Tone: within normal limits Gait & Station: normal Patient leans: N/A  Psychiatric Specialty Exam: Physical Exam Vitals and nursing note reviewed.  Constitutional:      Appearance:  He is well-developed.  HENT:     Head: Normocephalic and atraumatic.  Eyes:     Conjunctiva/sclera: Conjunctivae normal.     Pupils: Pupils are equal, round, and reactive to light.  Cardiovascular:     Heart sounds: Normal heart sounds.  Pulmonary:     Effort: Pulmonary effort is normal.  Abdominal:     Palpations: Abdomen is soft.  Musculoskeletal:        General: Normal range of motion.     Cervical back: Normal range of motion.  Skin:    General: Skin is warm and dry.  Neurological:     General: No focal deficit present.     Mental Status: He is alert.  Psychiatric:        Attention and Perception: Attention normal.        Mood and Affect: Mood is anxious and depressed.        Speech: Speech normal.        Behavior: Behavior normal.        Thought Content: Thought content includes suicidal ideation. Thought content does not include suicidal plan.        Cognition and Memory: Cognition normal.        Judgment: Judgment normal.     Review of Systems  Constitutional: Negative.   HENT: Negative.   Eyes: Negative.   Respiratory: Negative.   Cardiovascular: Negative.   Gastrointestinal: Negative.   Musculoskeletal: Negative.   Skin: Negative.   Neurological: Negative.   Psychiatric/Behavioral: Positive for dysphoric mood and suicidal ideas. The patient is nervous/anxious.     Blood pressure 136/88, pulse 60, temperature 97.9 F (36.6 C), temperature source Oral, resp. rate 18, height 6\' 1"  (1.854 m), weight 92.5 kg, SpO2  100 %.Body mass index is 26.91 kg/m.  General Appearance: Casual  Eye Contact:  Good  Speech:  Clear and Coherent  Volume:  Decreased  Mood:  Anxious and Depressed  Affect:  Congruent  Thought Process:  Coherent  Orientation:  Full (Time, Place, and Person)  Thought Content:  Logical and Rumination  Suicidal Thoughts:  Yes.  without intent/plan  Homicidal Thoughts:  No  Memory:  Immediate;   Fair Recent;   Poor Remote;   Fair  Judgement:  Fair   Insight:  Fair  Psychomotor Activity:  Decreased  Concentration:  Concentration: Fair  Recall:  [employer] of Knowledge:  Fair  Language:  Fair  Akathisia:  No  Handed:  Right  AIMS (if indicated):     Assets:  Desire for Improvement Physical Health  ADL's:  Intact  Cognition:  WNL  Sleep:  Number of Hours: 6      COGNITIVE FEATURES THAT CONTRIBUTE TO RISK:  None    SUICIDE RISK:   Mild:  Suicidal ideation of limited frequency, intensity, duration, and specificity.  There are no identifiable plans, no associated intent, mild dysphoria and related symptoms, good self-control (both objective and subjective assessment), few other risk factors, and identifiable protective factors, including available and accessible social support.  PLAN OF CARE: Patient with ongoing suicidal ideation.  Reports of depression.  Suicidal ideation.  Substance abuse.  Continue 15-minute checks.  Treat depression and constipation.  Work on arrangements for possible discharge planning  I certify that inpatient services furnished can reasonably be expected to improve the patient's condition.   Mordecai Rasmussen, MD 06/05/2020, 4:45 PM

## 2020-06-05 NOTE — Progress Notes (Signed)
Patient calm and pleasant during assessment denying SI/HI/AVH and pain. Patient endorses mild anxiety and depression stating he is worried about his stomach issues. Patient given education, support, and encouragement to be active in his treatment plan. Patient compliant with medication administration per MD orders. Patient observed by this Clinical research associate interacting appropriately with staff and peers on the unit. Patient being monitored Q 15 minutes for safety per unit protocol. Pt remains safe on the unit.

## 2020-06-05 NOTE — Progress Notes (Signed)
Admission Note:  39 yr male who presents IVC in no acute distress from medical floor for the  treatment of  Depression. Patient initially was on psychiatric floor but later transferred to medical floor because he clo chest pain.he appears flat and depressed, he was calm and cooperative with admission process. Patient denies SI/HI/AVH and contracts for safety upon arrival on unit. Patient has history of PTSD, Substance Abuse , Depression and Abdominal Pain. Patient's skin was assessed and found to have multiple scattered tattoos otherwise skin warm and dry, he was searched and no contraband found, POC and unit policies explained, understanding verbalized.and consents obtained. 15 minutes safetu checks maintained.

## 2020-06-05 NOTE — Tx Team (Signed)
Initial Treatment Plan 06/05/2020 4:11 AM Steven Khan [id number]    PATIENT STRESSORS: Health problems Medication change or noncompliance Occupational concerns   PATIENT STRENGTHS: Capable of independent living Motivation for treatment/growth   PATIENT IDENTIFIED PROBLEMS: Depression     PTSD    Substance Abuse     ABD pain          DISCHARGE CRITERIA:  Improved stabilization in mood, thinking, and/or behavior Medical problems require only outpatient monitoring Motivation to continue treatment in a less acute level of care  PRELIMINARY DISCHARGE PLAN: Outpatient therapy  PATIENT/FAMILY INVOLVEMENT: This treatment plan has been presented to and reviewed with the patient, Steven Khan, The patient and family have been given the opportunity to ask questions and make suggestions.  Trula Ore, RN 06/05/2020, 4:11 AM

## 2020-06-05 NOTE — Progress Notes (Signed)
Recreation Therapy Notes  INPATIENT RECREATION THERAPY ASSESSMENT  Patient Details Name: BURWELL BETHEL MRN: [id number] DOB: 1981/01/24 Today's Date: 06/05/2020       Information Obtained From: Patient  Able to Participate in Assessment/Interview: Yes  Patient Presentation: Responsive  Reason for Admission (Per Patient): Active Symptoms, Suicidal Ideation  Patient Stressors:    Coping Skills:   Substance Abuse, Talk  Leisure Interests (2+):   (Nothing)  Frequency of Recreation/Participation:    Awareness of Community Resources:  Yes  Community Resources:  [employer]  Current Use:    If no, Barriers?:    Expressed Interest in [employer] Information:    [place] of Residence:  Film/video editor  Patient Main Form of Transportation: Set designer  Patient Strengths:  None aint nobody good  Patient Identified Areas of Improvement:  N/A  Patient Goal for Hospitalization:  Get well  Current SI (including self-harm):  No  Current HI:  No  Current AVH: No  Staff Intervention Plan: Group Attendance, Collaborate with Interdisciplinary Treatment Team  Consent to Intern Participation: N/A  Sedale Jenifer 06/05/2020, 1:44 PM

## 2020-06-05 NOTE — Progress Notes (Signed)
Initial Nutrition Assessment  DOCUMENTATION CODES:   Not applicable  INTERVENTION:   Ensure Enlive po BID, each supplement provides 350 kcal and 20 grams of protein  MVI with minerals daily  NUTRITION DIAGNOSIS:   Unintentional weight loss related to social / environmental circumstances as evidenced by 12 percent weight loss in 3 months.  GOAL:   Patient will meet greater than or equal to 90% of their needs  MONITOR:   PO intake, Supplement acceptance  REASON FOR ASSESSMENT:   Malnutrition Screening Tool    ASSESSMENT:   39 y.o. male with medical history significant for homelessness and polysubstance abuse  admitted to behavioral health on 05/30/2020 under IVC, for suicidal attempt with plan, diagnosed with severe depression with psychosis, who developed intermittent abdominal pain associated with nausea on the a.m. of 06/01/2020, associated with constipation.   Pt with good appetite and oral intake while inpatient; pt eating 75-100% of meals and drinking Ensure supplements. RD will continue these while in hospital.   Per chart, pt weighed 89.8 kg (198 lb) on 05/06/20, he weighed 98.9 kg (217.58 lb) on 03/20/20, pt weighed 99.1 kg (218.02 lb) on 03/17/20, he weighed 97.5 kg (214.5 lb) on 03/16/20, and then on 02/19/20 he weighed 103 kg (227 lb). This indicates ~28 lb wt loss (12%) wt loss in the last 3 months which is significant.   Medications and labs reviewed  Diet Order:   Diet Order            Diet regular Room service appropriate? Yes; Fluid consistency: Thin  Diet effective now                EDUCATION NEEDS:   No education needs have been identified at this time  Skin:  Skin Assessment: Reviewed RN Assessment  Last BM:  8/18  Height:   Ht Readings from Last 1 Encounters:  06/04/20 6\' 1"  (1.854 m)    Weight:   Wt Readings from Last 1 Encounters:  06/04/20 92.5 kg   BMI:  Body mass index is 26.91 kg/m.  Estimated Nutritional Needs:   Kcal:   2300-2600kcal/day  Protein:  115-130g/day  Fluid:  >2.5L/day  06/06/20 MS, RD, LDN Please refer to [facility] for RD and/or RD on-call/weekend/after hours pager

## 2020-06-06 MED ORDER — POLYETHYLENE GLYCOL 3350 17 G PO PACK: 17.0000 g | PACK | Freq: Every day | ORAL | 0 refills | Status: DC

## 2020-06-06 MED ORDER — MIRTAZAPINE 15 MG PO TABS: 15.0000 mg | ORAL_TABLET | Freq: Every day | ORAL | 1 refills | Status: DC

## 2020-06-06 MED ORDER — PROMETHAZINE HCL 12.5 MG PO TABS: 12.5000 mg | ORAL_TABLET | Freq: Four times a day (QID) | ORAL | 1 refills | Status: DC | PRN

## 2020-06-06 MED ORDER — DOCUSATE SODIUM 100 MG PO CAPS: 200.0000 mg | ORAL_CAPSULE | Freq: Two times a day (BID) | ORAL | 1 refills | Status: DC

## 2020-06-06 MED ORDER — PROMETHAZINE HCL 12.5 MG PO TABS: 12.5000 mg | ORAL_TABLET | Freq: Four times a day (QID) | ORAL | 0 refills | Status: DC | PRN

## 2020-06-06 MED ORDER — DOCUSATE SODIUM 100 MG PO CAPS: 200.0000 mg | ORAL_CAPSULE | Freq: Two times a day (BID) | ORAL | 0 refills | Status: DC

## 2020-06-06 MED ORDER — PANTOPRAZOLE SODIUM 40 MG PO TBEC: 40.0000 mg | DELAYED_RELEASE_TABLET | Freq: Two times a day (BID) | ORAL | 0 refills | Status: DC

## 2020-06-06 MED ORDER — POLYETHYLENE GLYCOL 3350 17 G PO PACK: 17.0000 g | PACK | Freq: Every day | ORAL | 1 refills | Status: DC

## 2020-06-06 MED ORDER — MIRTAZAPINE 15 MG PO TABS: 15.0000 mg | ORAL_TABLET | Freq: Every day | ORAL | 0 refills | Status: DC

## 2020-06-06 MED ORDER — PANTOPRAZOLE SODIUM 40 MG PO TBEC: 40.0000 mg | DELAYED_RELEASE_TABLET | Freq: Two times a day (BID) | ORAL | 1 refills | Status: DC

## 2020-06-06 NOTE — Progress Notes (Signed)
Recreation Therapy Notes  Date: 06/06/2020  Time: 9:30 am   Location: Room 21   Behavioral response: N/A   Intervention Topic: Animal Assisted therapy    Discussion/Intervention: Patient did not attend group.   Clinical Observations/Feedback:  Patient did not attend group.   Ellagrace Yoshida LRT/CTRS        Saleem Coccia 06/06/2020 11:30 AM

## 2020-06-06 NOTE — Tx Team (Signed)
Interdisciplinary Treatment and Diagnostic Plan Update  06/06/2020 Time of Session: 9AM  Steven Khan MRN: [id number]  Principal Diagnosis: Severe recurrent major depression without psychotic features [facility])  Secondary Diagnoses: Principal Problem:   Severe recurrent major depression without psychotic features (HCC) Active Problems:   PTSD (post-traumatic stress disorder)   Alcohol abuse   Cocaine abuse (HCC)   Abdominal pain   Current Medications:  Current Facility-Administered Medications  Medication Dose Route Frequency Provider Last Rate Last Admin  . acetaminophen (TYLENOL) tablet 650 mg  650 mg Oral Q6H PRN Clapacs, John T, MD      . alum & mag hydroxide-simeth (MAALOX/MYLANTA) 200-200-20 MG/5ML suspension 30 mL  30 mL Oral Q4H PRN Clapacs, John T, MD      . docusate sodium (COLACE) capsule 200 mg  200 mg Oral BID Clapacs, Jackquline Denmark, MD   200 mg at 06/06/20 0821  . feeding supplement (ENSURE ENLIVE) (ENSURE ENLIVE) liquid 237 mL  237 mL Oral TID WC Clapacs, John T, MD   237 mL at 06/05/20 1700  . magnesium hydroxide (MILK OF MAGNESIA) suspension 30 mL  30 mL Oral Daily PRN Clapacs, John T, MD      . mirtazapine (REMERON) tablet 15 mg  15 mg Oral QHS Clapacs, Jackquline Denmark, MD   15 mg at 06/05/20 2118  . multivitamin with minerals tablet 1 tablet  1 tablet Oral Daily Clapacs, Jackquline Denmark, MD   1 tablet at 06/06/20 [phone]  . pantoprazole (PROTONIX) EC tablet 40 mg  40 mg Oral BID Clapacs, Jackquline Denmark, MD   40 mg at 06/06/20 3016  . polyethylene glycol (MIRALAX / GLYCOLAX) packet 17 g  17 g Oral Daily Clapacs, Jackquline Denmark, MD   17 g at 06/06/20 0109  . promethazine (PHENERGAN) tablet 12.5 mg  12.5 mg Oral Q6H PRN Clapacs, Jackquline Denmark, MD       PTA Medications: Medications Prior to Admission  Medication Sig Dispense Refill Last Dose  . pantoprazole (PROTONIX) 40 MG tablet Take 1 tablet (40 mg total) by mouth daily. 30 tablet 0   . promethazine (PHENERGAN) 25 MG tablet Take 25 mg by mouth every 6 (six) hours  as needed for nausea or vomiting.     Marland Kitchen QUEtiapine (SEROQUEL) 25 MG tablet Take 1 tablet (25 mg total) by mouth at bedtime.       Patient Stressors: Health problems Medication change or noncompliance Occupational concerns  Patient Strengths: Capable of independent living Motivation for treatment/growth  Treatment Modalities: Medication Management, Group therapy, Case management,  1 to 1 session with clinician, Psychoeducation, Recreational therapy.   Physician Treatment Plan for Primary Diagnosis: Severe recurrent major depression without psychotic features (HCC) Long Term Goal(s): Improvement in symptoms so as ready for discharge Improvement in symptoms so as ready for discharge   Short Term Goals: Ability to disclose and discuss suicidal ideas Ability to demonstrate self-control will improve Ability to maintain clinical measurements within normal limits will improve  Medication Management: Evaluate patient's response, side effects, and tolerance of medication regimen.  Therapeutic Interventions: 1 to 1 sessions, Unit Group sessions and Medication administration.  Evaluation of Outcomes: Adequate for Discharge  Physician Treatment Plan for Secondary Diagnosis: Principal Problem:   Severe recurrent major depression without psychotic features (HCC) Active Problems:   PTSD (post-traumatic stress disorder)   Alcohol abuse   Cocaine abuse (HCC)   Abdominal pain  Long Term Goal(s): Improvement in symptoms so as ready for discharge Improvement in symptoms so as  ready for discharge   Short Term Goals: Ability to disclose and discuss suicidal ideas Ability to demonstrate self-control will improve Ability to maintain clinical measurements within normal limits will improve     Medication Management: Evaluate patient's response, side effects, and tolerance of medication regimen.  Therapeutic Interventions: 1 to 1 sessions, Unit Group sessions and Medication  administration.  Evaluation of Outcomes: Adequate for Discharge   RN Treatment Plan for Primary Diagnosis: Severe recurrent major depression without psychotic features (HCC) Long Term Goal(s): Knowledge of disease and therapeutic regimen to maintain health will improve  Short Term Goals: Ability to verbalize frustration and anger appropriately will improve, Ability to demonstrate self-control, Ability to participate in decision making will improve, Ability to verbalize feelings will improve, Ability to disclose and discuss suicidal ideas and Compliance with prescribed medications will improve  Medication Management: RN will administer medications as ordered by provider, will assess and evaluate patient's response and provide education to patient for prescribed medication. RN will report any adverse and/or side effects to prescribing provider.  Therapeutic Interventions: 1 on 1 counseling sessions, Psychoeducation, Medication administration, Evaluate responses to treatment, Monitor vital signs and CBGs as ordered, Perform/monitor CIWA, COWS, AIMS and Fall Risk screenings as ordered, Perform wound care treatments as ordered.  Evaluation of Outcomes: Adequate for Discharge   LCSW Treatment Plan for Primary Diagnosis: Severe recurrent major depression without psychotic features (HCC) Long Term Goal(s): Safe transition to appropriate next level of care at discharge, Engage patient in therapeutic group addressing interpersonal concerns.  Short Term Goals: Engage patient in aftercare planning with referrals and resources, Increase social support, Increase ability to appropriately verbalize feelings, Increase emotional regulation, Facilitate acceptance of mental health diagnosis and concerns and Increase skills for wellness and recovery  Therapeutic Interventions: Assess for all discharge needs, 1 to 1 time with Social worker, Explore available resources and support systems, Assess for adequacy in  community support network, Educate family and significant other(s) on suicide prevention, Complete Psychosocial Assessment, Interpersonal group therapy.  Evaluation of Outcomes: Adequate for Discharge   Progress in Treatment: Attending groups: No. Participating in groups: No. Taking medication as prescribed: Yes. Toleration medication: Yes. Family/Significant other contact made: Yes, individual(s) contacted:  SPE completed with patient.  Patient declined SPE contact. Patient understands diagnosis: Yes. Discussing patient identified problems/goals with staff: Yes. Medical problems stabilized or resolved: Yes. Denies suicidal/homicidal ideation: Yes. Issues/concerns per patient self-inventory: No. Other: none  New problem(s) identified: No, Describe:  none  New Short Term/Long Term Goal(s): detox, elimination of symptoms of psychosis, medication management for mood stabilization; elimination of SI thoughts; development of comprehensive mental wellness/sobriety plan.  Patient Goals:  "to get out of here"  Discharge Plan or Barriers: Patient reports plans to go to his mother's home [place]. He plans to follow up with [facility] for aftercare.   Reason for Continuation of Hospitalization: Aggression Anxiety Depression Medication stabilization Suicidal ideation  Estimated Length of Stay:  1-7 days  Attendees: Patient: Steven Khan 06/06/2020 11:43 AM  Physician: Dr. Toni Amend, MD 06/06/2020 11:43 AM  Nursing: Torrie Mayers, RN 06/06/2020 11:43 AM  RN Care Manager: 06/06/2020 11:43 AM  Social Worker: Penni Homans, LCSW 06/06/2020 11:43 AM  Recreational Therapist: Garret Reddish, Drue Flirt, LRT 06/06/2020 11:43 AM  Other: Lowella Dandy, LCSW 06/06/2020 11:43 AM  Other:  06/06/2020 11:43 AM  Other: 06/06/2020 11:43 AM    Scribe for Treatment Team: Harden Mo, LCSW 06/06/2020 11:43 AM

## 2020-06-06 NOTE — [facility] Suicide Risk Assessment (Signed)
[facility] Discharge Suicide Risk Assessment   Principal Problem: Severe recurrent major depression without psychotic features [facility]) Discharge Diagnoses: Principal Problem:   Severe recurrent major depression without psychotic features (HCC) Active Problems:   PTSD (post-traumatic stress disorder)   Alcohol abuse   Cocaine abuse (HCC)   Abdominal pain   Total Time spent with patient: 30 minutes  Musculoskeletal: Strength & Muscle Tone: within normal limits Gait & Station: normal Patient leans: N/A  Psychiatric Specialty Exam: Review of Systems  Constitutional: Negative.   HENT: Negative.   Eyes: Negative.   Respiratory: Negative.   Cardiovascular: Negative.   Gastrointestinal: Positive for abdominal pain.  Musculoskeletal: Negative.   Skin: Negative.   Neurological: Negative.   Psychiatric/Behavioral: Negative.     Blood pressure 123/89, pulse 70, temperature 97.6 F (36.4 C), temperature source Oral, resp. rate 18, height 6\' 1"  (1.854 m), weight 92.5 kg, SpO2 99 %.Body mass index is 26.91 kg/m.  General Appearance: Casual  Eye Contact::  Fair  Speech:  Clear and Coherent409  Volume:  Normal  Mood:  Euthymic  Affect:  Congruent  Thought Process:  Goal Directed  Orientation:  Full (Time, Place, and Person)  Thought Content:  Logical  Suicidal Thoughts:  No  Homicidal Thoughts:  No  Memory:  Immediate;   Fair Recent;   Fair Remote;   Fair  Judgement:  Fair  Insight:  Fair  Psychomotor Activity:  Normal  Concentration:  Fair  Recall:  [phone] of Knowledge:Fair  Language: Fair  Akathisia:  No  Handed:  Right  AIMS (if indicated):     Assets:  Desire for Improvement  Sleep:  Number of Hours: 7.25  Cognition: WNL  ADL's:  Intact   Mental Status Per Nursing Assessment::   On Admission:  NA  Demographic Factors:  Male and Low socioeconomic status  Loss Factors: Decline in physical health  Historical Factors: Impulsivity  Risk Reduction Factors:    Positive coping skills or problem solving skills  Continued Clinical Symptoms:  Depression:   Impulsivity Medical Diagnoses and Treatments/Surgeries  Cognitive Features That Contribute To Risk:  None    Suicide Risk:  Minimal: No identifiable suicidal ideation.  Patients presenting with no risk factors but with morbid ruminations; may be classified as minimal risk based on the severity of the depressive symptoms    Plan Of Care/Follow-up recommendations:  Activity:  Activity as tolerated Diet:  Regular diet as tolerated Other:  Patient is to follow-up with [facility] at [address] on September 9 at 1:15 PM  04-03-1997, MD 06/06/2020, 10:48 AM

## 2020-06-06 NOTE — Plan of Care (Signed)
Pt rates depression and anxiety both 10/10. Pt denies HI and AVH but has passive SI with no plan and will not contract for safety but says "I'll try". MD notified. Pt was educated on care plan and verbalizes understanding. Torrie Mayers RN Problem: Education: Goal: Knowledge of [place] [facility] Education information/materials will improve Outcome: Progressing Goal: Emotional status will improve Outcome: Progressing Goal: Mental status will improve Outcome: Progressing Goal: Verbalization of understanding the information provided will improve Outcome: Progressing   Problem: Activity: Goal: Interest or engagement in activities will improve Outcome: Progressing Goal: Sleeping patterns will improve Outcome: Progressing   Problem: Coping: Goal: Ability to verbalize frustrations and anger appropriately will improve Outcome: Progressing Goal: Ability to demonstrate self-control will improve Outcome: Progressing   Problem: Health Behavior/Discharge Planning: Goal: Identification of resources available to assist in meeting health care needs will improve Outcome: Progressing Goal: Compliance with treatment plan for underlying cause of condition will improve Outcome: Progressing   Problem: Physical Regulation: Goal: Ability to maintain clinical measurements within normal limits will improve Outcome: Progressing   Problem: Safety: Goal: Periods of time without injury will increase Outcome: Progressing   Problem: Education: Goal: Ability to make informed decisions regarding treatment will improve Outcome: Progressing   Problem: Coping: Goal: Coping ability will improve Outcome: Progressing   Problem: Health Behavior/Discharge Planning: Goal: Identification of resources available to assist in meeting health care needs will improve Outcome: Progressing   Problem: Medication: Goal: Compliance with prescribed medication regimen will improve Outcome: Progressing   Problem:  Self-Concept: Goal: Ability to disclose and discuss suicidal ideas will improve Outcome: Progressing Goal: Will verbalize positive feelings about self Outcome: Progressing   Problem: Education: Goal: Knowledge of disease or condition will improve Outcome: Progressing Goal: Understanding of discharge needs will improve Outcome: Progressing   Problem: Health Behavior/Discharge Planning: Goal: Ability to identify changes in lifestyle to reduce recurrence of condition will improve Outcome: Progressing Goal: Identification of resources available to assist in meeting health care needs will improve Outcome: Progressing   Problem: Physical Regulation: Goal: Complications related to the disease process, condition or treatment will be avoided or minimized Outcome: Progressing   Problem: Safety: Goal: Ability to remain free from injury will improve Outcome: Progressing

## 2020-06-06 NOTE — Discharge Summary (Signed)
Physician Discharge Summary Note  Patient:  Steven Khan is an 39 y.o., male MRN:  [id number] DOB:  Nov 02, 1980 Patient phone:  [phone] (home)  Patient address:   [address] [place] [address],  Total Time spent with patient: 30 minutes  Date of Admission:  06/04/2020 Date of Discharge: 06/06/2020  Reason for Admission: Patient admitted in transfer from the medical service after stabilization for abdominal and chest pain.  Patient with recurrent episodes of moodiness depression and impulsive statements of suicidal ideation  Principal Problem: Severe recurrent major depression without psychotic features [facility]) Discharge Diagnoses: Principal Problem:   Severe recurrent major depression without psychotic features (HCC) Active Problems:   PTSD (post-traumatic stress disorder)   Alcohol abuse   Cocaine abuse (HCC)   Abdominal pain   Past Psychiatric History: Past history of some mental health treatment and impulsivity and anxiety while in prison  Past Medical History: History reviewed. No pertinent past medical history.  Past Surgical History:  Procedure Laterality Date  . HERNIA REPAIR     Family History: History reviewed. No pertinent family history. Family Psychiatric  History: None reported Social History:  Social History   Substance and Sexual Activity  Alcohol Use Yes     Social History   Substance and Sexual Activity  Drug Use Yes  . Types: Cocaine, Marijuana   Comment: ecstasy    Social History   Socioeconomic History  . Marital status: Divorced    Spouse name: Not on file  . Number of children: Not on file  . Years of education: Not on file  . Highest education level: Not on file  Occupational History  . Not on file  Tobacco Use  . Smoking status: Current Every Day Smoker  . Smokeless tobacco: Never Used  Substance and Sexual Activity  . Alcohol use: Yes  . Drug use: Yes    Types: Cocaine, Marijuana    Comment: ecstasy  . Sexual activity: Not  on file  Other Topics Concern  . Not on file  Social History Narrative  . Not on file   Social Determinants of Health   Financial Resource Strain:   . Difficulty of Paying Living Expenses: Not on file  Food Insecurity:   . Worried About Programme researcher, broadcasting/film/video in the Last Year: Not on file  . Ran Out of Food in the Last Year: Not on file  Transportation Needs:   . Lack of Transportation (Medical): Not on file  . Lack of Transportation (Non-Medical): Not on file  Physical Activity:   . Days of Exercise per Week: Not on file  . Minutes of Exercise per Session: Not on file  Stress:   . Feeling of Stress : Not on file  Social Connections:   . Frequency of Communication with Friends and Family: Not on file  . Frequency of Social Gatherings with Friends and Family: Not on file  . Attends Religious Services: Not on file  . Active Member of Clubs or Organizations: Not on file  . Attends Banker Meetings: Not on file  . Marital Status: Not on file    Hospital Course: Patient was kept on 15-minute checks.  No behavior problems encountered.  Mood stated as being nervous but at the time of discharge she completely denied any depression denied any suicidal ideation and denied any thoughts of hurting himself or anyone else.  His affect appeared to be euthymic.  He was questioned about his earlier statements of being suicidal and told me  that he only said that because he was angry at his nurse.  He completely denied any wish to harm himself and was focused primarily on follow-up for his abdominal pain.  He has an appointment to see the gastroenterologist on September 9 at 115 and that information will be provided for him.  I have requested a 7-day supply of medication and provided prescriptions at discharge with follow-up recommendations to [facility]  Physical Findings: AIMS:  , ,  ,  ,    CIWA:    COWS:     Musculoskeletal: Strength & Muscle Tone: within normal limits Gait & Station:  normal Patient leans: N/A  Psychiatric Specialty Exam: Physical Exam Vitals and nursing note reviewed.  Constitutional:      Appearance: He is well-developed.  HENT:     Head: Normocephalic and atraumatic.  Eyes:     Conjunctiva/sclera: Conjunctivae normal.     Pupils: Pupils are equal, round, and reactive to light.  Cardiovascular:     Heart sounds: Normal heart sounds.  Pulmonary:     Effort: Pulmonary effort is normal.  Abdominal:     Palpations: Abdomen is soft.  Musculoskeletal:        General: Normal range of motion.     Cervical back: Normal range of motion.  Skin:    General: Skin is warm and dry.  Neurological:     General: No focal deficit present.     Mental Status: He is alert.  Psychiatric:        Attention and Perception: Attention normal.        Mood and Affect: Mood normal.        Speech: Speech normal.        Behavior: Behavior normal.        Thought Content: Thought content normal.     Review of Systems  Constitutional: Negative.   HENT: Negative.   Eyes: Negative.   Respiratory: Negative.   Cardiovascular: Negative.   Gastrointestinal: Negative.   Musculoskeletal: Negative.   Skin: Negative.   Neurological: Negative.   Psychiatric/Behavioral: Negative.     Blood pressure 123/89, pulse 70, temperature 97.6 F (36.4 C), temperature source Oral, resp. rate 18, height 6\' 1"  (1.854 m), weight 92.5 kg, SpO2 99 %.Body mass index is 26.91 kg/m.  General Appearance: Casual  Eye Contact:  Good  Speech:  Clear and Coherent  Volume:  Normal  Mood:  Euthymic  Affect:  Congruent  Thought Process:  Goal Directed  Orientation:  Full (Time, Place, and Person)  Thought Content:  Logical  Suicidal Thoughts:  No  Homicidal Thoughts:  No  Memory:  Immediate;   Fair Recent;   Fair Remote;   Fair  Judgement:  Fair  Insight:  Fair  Psychomotor Activity:  Normal  Concentration:  Concentration: Fair  Recall:  of Knowledge:  Fair  Language:   Fair  Akathisia:  No  Handed:  Right  AIMS (if indicated):     Assets:  Desire for Improvement Talents/Skills  ADL's:  Intact  Cognition:  WNL  Sleep:  Number of Hours: 7.25        Has this patient used any form of tobacco in the last 30 days? (Cigarettes, Smokeless Tobacco, Cigars, and/or Pipes) Yes, No  Blood Alcohol level:  Lab Results  Component Value Date   ETH <10 05/30/2020    Metabolic Disorder Labs:  No results found for: HGBA1C, MPG No results found for: PROLACTIN Lab Results  Component Value  Date   CHOL 206 (H) 06/01/2020   TRIG 70 06/01/2020   HDL 57 06/01/2020   CHOLHDL 3.6 06/01/2020   VLDL 14 06/01/2020   LDLCALC 135 (H) 06/01/2020    See Psychiatric Specialty Exam and Suicide Risk Assessment completed by Attending Physician prior to discharge.  Discharge destination:  Home  Is patient on multiple antipsychotic therapies at discharge:  No   Has Patient had three or more failed trials of antipsychotic monotherapy by history:  No  Recommended Plan for Multiple Antipsychotic Therapies: NA  Discharge Instructions    Diet - low sodium heart healthy   Complete by: As directed    Increase activity slowly   Complete by: As directed      Allergies as of 06/06/2020   No Known Allergies     Medication List    STOP taking these medications   QUEtiapine 25 MG tablet Commonly known as: SEROQUEL     TAKE these medications     Indication  docusate sodium 100 MG capsule Commonly known as: COLACE Take 2 capsules (200 mg total) by mouth 2 (two) times daily.  Indication: Constipation   mirtazapine 15 MG tablet Commonly known as: REMERON Take 1 tablet (15 mg total) by mouth at bedtime.  Indication: Major Depressive Disorder   pantoprazole 40 MG tablet Commonly known as: PROTONIX Take 1 tablet (40 mg total) by mouth 2 (two) times daily. What changed: when to take this  Indication: Gastroesophageal Reflux Disease   polyethylene glycol 17 g  packet Commonly known as: MIRALAX / GLYCOLAX Take 17 g by mouth daily. Start taking on: June 07, 2020  Indication: Constipation   promethazine 12.5 MG tablet Commonly known as: PHENERGAN Take 1 tablet (12.5 mg total) by mouth every 6 (six) hours as needed for nausea or vomiting. What changed:   medication strength  how much to take  Indication: Nausea and Vomiting        Follow-up recommendations:  Activity:  Activity as tolerated Diet:  Regular diet as tolerated Other:  Follow-up with [facility] and also with gastroenterology  Comments: Prescriptions and 7-day supply to be provided at discharge  Signed: Mordecai Rasmussen, MD 06/06/2020, 10:53 AM

## 2020-06-06 NOTE — Plan of Care (Signed)
  Problem: Group Participation Goal: STG - Patient will engage in groups without prompting or encouragement from LRT x3 group sessions within 5 recreation therapy group sessions Description: STG - Patient will engage in groups without prompting or encouragement from LRT x3 group sessions within 5 recreation therapy group sessions 06/06/2020 1337 by Ernest Haber, LRT Outcome: Not Applicable [id number] by Ernest Haber, LRT Outcome: Not Met (add Reason) Note: Patient did not attend any groups.

## 2020-06-06 NOTE — Progress Notes (Signed)
Recreation Therapy Notes  INPATIENT RECREATION TR PLAN  Patient Details Name: Steven Khan MRN: [id number] DOB: 1981-09-02 Today's Date: 06/06/2020  Rec Therapy Plan Is patient appropriate for Therapeutic Recreation?: Yes Treatment times per week: at least 3 Estimated Length of Stay: 5-7 days TR Treatment/Interventions: Group participation (Comment)  Discharge Criteria Pt will be discharged from therapy if:: Discharged Treatment plan/goals/alternatives discussed and agreed upon by:: Patient/family  Discharge Summary Short term goals set: Patient will engage in groups without prompting or encouragement from LRT x3 group sessions within 5 recreation therapy group sessions Short term goals met: Not met Reason goals not met: Patient did not attend any groups Therapeutic equipment acquired: N/A Reason patient discharged from therapy: Discharge from hospital Pt/family agrees with progress & goals achieved: Yes Date patient discharged from therapy: 06/06/20   Noland Pizano 06/06/2020, 1:38 PM

## 2020-06-06 NOTE — Plan of Care (Signed)
Problem: Education: Goal: Knowledge of [facility] Education information/materials will improve 06/06/2020 1120 by Chalmers Cater, RN Outcome: Adequate for Discharge 06/06/2020 1006 by Chalmers Cater, RN Outcome: Progressing Goal: Emotional status will improve 06/06/2020 1120 by Chalmers Cater, RN Outcome: Adequate for Discharge 06/06/2020 1006 by Chalmers Cater, RN Outcome: Progressing Goal: Mental status will improve 06/06/2020 1120 by Chalmers Cater, RN Outcome: Adequate for Discharge 06/06/2020 1006 by Chalmers Cater, RN Outcome: Progressing Goal: Verbalization of understanding the information provided will improve 06/06/2020 1120 by Chalmers Cater, RN Outcome: Adequate for Discharge 06/06/2020 1006 by Chalmers Cater, RN Outcome: Progressing   Problem: Activity: Goal: Interest or engagement in activities will improve 06/06/2020 1120 by Chalmers Cater, RN Outcome: Adequate for Discharge 06/06/2020 1006 by Chalmers Cater, RN Outcome: Progressing Goal: Sleeping patterns will improve 06/06/2020 1120 by Chalmers Cater, RN Outcome: Adequate for Discharge 06/06/2020 1006 by Chalmers Cater, RN Outcome: Progressing   Problem: Coping: Goal: Ability to verbalize frustrations and anger appropriately will improve 06/06/2020 1120 by Chalmers Cater, RN Outcome: Adequate for Discharge 06/06/2020 1006 by Chalmers Cater, RN Outcome: Progressing Goal: Ability to demonstrate self-control will improve 06/06/2020 1120 by Chalmers Cater, RN Outcome: Adequate for Discharge 06/06/2020 1006 by Chalmers Cater, RN Outcome: Progressing   Problem: Health Behavior/Discharge Planning: Goal: Identification of resources available to assist in meeting health care needs will improve 06/06/2020 1120 by Chalmers Cater, RN Outcome: Adequate for Discharge 06/06/2020 1006 by Chalmers Cater, RN Outcome: Progressing Goal: Compliance with treatment plan for underlying cause of condition will  improve 06/06/2020 1120 by Chalmers Cater, RN Outcome: Adequate for Discharge 06/06/2020 1006 by Chalmers Cater, RN Outcome: Progressing   Problem: Physical Regulation: Goal: Ability to maintain clinical measurements within normal limits will improve 06/06/2020 1120 by Chalmers Cater, RN Outcome: Adequate for Discharge 06/06/2020 1006 by Chalmers Cater, RN Outcome: Progressing   Problem: Safety: Goal: Periods of time without injury will increase 06/06/2020 1120 by Chalmers Cater, RN Outcome: Adequate for Discharge 06/06/2020 1006 by Chalmers Cater, RN Outcome: Progressing   Problem: Education: Goal: Ability to make informed decisions regarding treatment will improve 06/06/2020 1120 by Chalmers Cater, RN Outcome: Adequate for Discharge 06/06/2020 1006 by Chalmers Cater, RN Outcome: Progressing   Problem: Coping: Goal: Coping ability will improve 06/06/2020 1120 by Chalmers Cater, RN Outcome: Adequate for Discharge 06/06/2020 1006 by Chalmers Cater, RN Outcome: Progressing   Problem: Health Behavior/Discharge Planning: Goal: Identification of resources available to assist in meeting health care needs will improve 06/06/2020 1120 by Chalmers Cater, RN Outcome: Adequate for Discharge 06/06/2020 1006 by Chalmers Cater, RN Outcome: Progressing   Problem: Medication: Goal: Compliance with prescribed medication regimen will improve 06/06/2020 1120 by Chalmers Cater, RN Outcome: Adequate for Discharge 06/06/2020 1006 by Chalmers Cater, RN Outcome: Progressing   Problem: Self-Concept: Goal: Ability to disclose and discuss suicidal ideas will improve 06/06/2020 1120 by Chalmers Cater, RN Outcome: Adequate for Discharge 06/06/2020 1006 by Chalmers Cater, RN Outcome: Progressing Goal: Will verbalize positive feelings about self 06/06/2020 1120 by Chalmers Cater, RN Outcome: Adequate for Discharge 06/06/2020 1006 by Chalmers Cater, RN Outcome: Progressing   Problem:  Education: Goal: Knowledge of disease or condition will improve 06/06/2020 1120 by Chalmers Cater, RN Outcome: Adequate for Discharge 06/06/2020 1006 by Chalmers Cater, RN Outcome: Progressing Goal:  Understanding of discharge needs will improve 06/06/2020 1120 by Chalmers Cater, RN Outcome: Adequate for Discharge 06/06/2020 1006 by Chalmers Cater, RN Outcome: Progressing   Problem: Health Behavior/Discharge Planning: Goal: Ability to identify changes in lifestyle to reduce recurrence of condition will improve 06/06/2020 1120 by Chalmers Cater, RN Outcome: Adequate for Discharge 06/06/2020 1006 by Chalmers Cater, RN Outcome: Progressing Goal: Identification of resources available to assist in meeting health care needs will improve 06/06/2020 1120 by Chalmers Cater, RN Outcome: Adequate for Discharge 06/06/2020 1006 by Chalmers Cater, RN Outcome: Progressing   Problem: Physical Regulation: Goal: Complications related to the disease process, condition or treatment will be avoided or minimized 06/06/2020 1120 by Chalmers Cater, RN Outcome: Adequate for Discharge 06/06/2020 1006 by Chalmers Cater, RN Outcome: Progressing   Problem: Safety: Goal: Ability to remain free from injury will improve 06/06/2020 1120 by Chalmers Cater, RN Outcome: Adequate for Discharge 06/06/2020 1006 by Chalmers Cater, RN Outcome: Progressing

## 2020-06-06 NOTE — Progress Notes (Signed)
°  [facility] Adult Case Management Discharge Plan :  Will you be returning to the same living situation after discharge:  Yes,  pt reports that he is going to his mothers home.  At discharge, do you have transportation home?: Yes,  CSW will asssit with transportation needs. Do you have the ability to pay for your medications: No.  Release of information consent forms completed and in the chart;  Patient's signature needed at discharge.  Patient to Follow up at:  Follow-up Information    [facility] Follow up on 06/13/2020.   Why: Appointment is scheduled with Lorella Nimrod, Peer Support, for 06/13/2020 at 7AM via Zoom.  Thanks! Contact information: [address] [place] [address] [phone]        Wyline Mood, MD. Go on 06/27/2020.   Specialty: Gastroenterology Why: You have an an appointment to see Dr. Tobi Bastos at [facility] gastroenterology on September 9 at 1:15 PM.  Thanks! Contact information: [address] [place] [address] [phone]               Next level of care provider has access to [facility] Link:no  Safety Planning and Suicide Prevention discussed: No.  SPE completed with the patient.  He declined collateral contact.      Has patient been referred to the Quitline?: Patient refused referral  Patient has been referred for addiction treatment: Pt. refused referral  Harden Mo, LCSW 06/06/2020, 11:48 AM

## 2020-06-06 NOTE — Progress Notes (Signed)
Pt denies SI, HI and AVH. Pt  received belongings, prescriptions, medications and dc packet. Pt was educated on dc plan and verbalizes understanding. Torrie Mayers RN

## 2020-06-27 ENCOUNTER — Telehealth: Payer: Self-pay | Admitting: Gerontology

## 2020-06-27 ENCOUNTER — Ambulatory Visit: Payer: Self-pay | Admitting: Gastroenterology

## 2020-06-27 NOTE — Telephone Encounter (Signed)
-----   Message from Jennifer Rolen, CMA sent at 06/06/2020 10:20 AM EDT ----- Regarding: [facility] referral Referral from hospital. Please call pt to review eligibility and necessary paperwork. ----- Message ----- From: Russoli, [place] A, RN Sent: 06/03/2020  10:06 AM EDT To: Jennifer Rolen, CMA  Please accept referral for [facility] to establish PCP care.    

## 2020-06-27 NOTE — Telephone Encounter (Signed)
-----   Message from Benjamin Stain, [place] sent at 06/06/2020 10:20 AM EDT ----- Regarding: [facility] referral Referral from hospital. Please call pt to review eligibility and necessary paperwork. ----- Message ----- From: Shawn Route, RN Sent: 06/03/2020  10:06 AM EDT To: Benjamin Stain, CMA  Please accept referral for [facility] to establish PCP care.

## 2020-07-07 ENCOUNTER — Encounter: Payer: Self-pay | Admitting: Emergency Medicine

## 2020-07-07 ENCOUNTER — Emergency Department
Admission: EM | Admit: 2020-07-07 | Discharge: 2020-07-09 | Disposition: A | Discharge status: Home / Self Care | Payer: Self-pay | Attending: Emergency Medicine | Admitting: Emergency Medicine

## 2020-07-07 ENCOUNTER — Other Ambulatory Visit: Payer: Self-pay

## 2020-07-07 DIAGNOSIS — F989 Unspecified behavioral and emotional disorders with onset usually occurring in childhood and adolescence: Secondary | ICD-10-CM | POA: Insufficient documentation

## 2020-07-07 DIAGNOSIS — N179 Acute kidney failure, unspecified: Secondary | ICD-10-CM | POA: Insufficient documentation

## 2020-07-07 DIAGNOSIS — F1910 Other psychoactive substance abuse, uncomplicated: Secondary | ICD-10-CM | POA: Insufficient documentation

## 2020-07-07 DIAGNOSIS — R4689 Other symptoms and signs involving appearance and behavior: Secondary | ICD-10-CM

## 2020-07-07 DIAGNOSIS — R1110 Vomiting, unspecified: Secondary | ICD-10-CM | POA: Insufficient documentation

## 2020-07-07 DIAGNOSIS — F1494 Cocaine use, unspecified with cocaine-induced mood disorder: Secondary | ICD-10-CM | POA: Diagnosis present

## 2020-07-07 DIAGNOSIS — E860 Dehydration: Secondary | ICD-10-CM | POA: Insufficient documentation

## 2020-07-07 DIAGNOSIS — Z20822 Contact with and (suspected) exposure to covid-19: Secondary | ICD-10-CM | POA: Insufficient documentation

## 2020-07-07 DIAGNOSIS — F17210 Nicotine dependence, cigarettes, uncomplicated: Secondary | ICD-10-CM | POA: Insufficient documentation

## 2020-07-07 LAB — BASIC METABOLIC PANEL
Anion gap: 11 (ref 5–15)
BUN: 12 mg/dL (ref 6–20)
CO2: 25 mmol/L (ref 22–32)
Calcium: 8.3 mg/dL — ABNORMAL LOW (ref 8.9–10.3)
Chloride: 101 mmol/L (ref 98–111)
Creatinine, Ser: 1.34 mg/dL — ABNORMAL HIGH (ref 0.61–1.24)
GFR calc Af Amer: >60 mL/min (ref >60)
GFR calc non Af Amer: >60 mL/min (ref >60)
Glucose, Bld: 86 mg/dL (ref 70–99)
Potassium: 3.3 mmol/L — ABNORMAL LOW (ref 3.5–5.1)
Sodium: 137 mmol/L (ref 135–145)

## 2020-07-07 LAB — LIPASE, BLOOD: Lipase: 25 U/L (ref 11–51)

## 2020-07-07 LAB — COMPREHENSIVE METABOLIC PANEL
ALT: 16 U/L (ref 0–44)
AST: 36 U/L (ref 15–41)
Albumin: 4.6 g/dL (ref 3.5–5.0)
Alkaline Phosphatase: 47 U/L (ref 38–126)
Anion gap: 19 — ABNORMAL HIGH (ref 5–15)
BUN: 12 mg/dL (ref 6–20)
CO2: 19 mmol/L — ABNORMAL LOW (ref 22–32)
Calcium: 9.1 mg/dL (ref 8.9–10.3)
Chloride: 99 mmol/L (ref 98–111)
Creatinine, Ser: 1.5 mg/dL — ABNORMAL HIGH (ref 0.61–1.24)
GFR calc Af Amer: >60 mL/min (ref >60)
GFR calc non Af Amer: 58 mL/min — ABNORMAL LOW (ref >60)
Glucose, Bld: 144 mg/dL — ABNORMAL HIGH (ref 70–99)
Potassium: 3.7 mmol/L (ref 3.5–5.1)
Sodium: 137 mmol/L (ref 135–145)
Total Bilirubin: 1.3 mg/dL — ABNORMAL HIGH (ref 0.3–1.2)
Total Protein: 7.5 g/dL (ref 6.5–8.1)

## 2020-07-07 LAB — CBC
HCT: 43.4 % (ref 39.0–52.0)
Hemoglobin: 15 g/dL (ref 13.0–17.0)
MCH: 33.1 pg (ref 26.0–34.0)
MCHC: 34.6 g/dL (ref 30.0–36.0)
MCV: 95.8 fL (ref 80.0–100.0)
Platelets: 302 10*3/uL (ref 150–400)
RBC: 4.53 MIL/uL (ref 4.22–5.81)
RDW: 13.9 % (ref 11.5–15.5)
WBC: 11.2 10*3/uL — ABNORMAL HIGH (ref 4.0–10.5)
nRBC: 0 % (ref 0.0–0.2)

## 2020-07-07 LAB — ETHANOL: Alcohol, Ethyl (B): <10 mg/dL (ref <10)

## 2020-07-07 LAB — ACETAMINOPHEN LEVEL: Acetaminophen (Tylenol), Serum: <10 ug/mL — ABNORMAL LOW (ref 10–30)

## 2020-07-07 LAB — SARS CORONAVIRUS 2 BY RT PCR (HOSPITAL ORDER, PERFORMED IN ~~LOC~~ HOSPITAL LAB): SARS Coronavirus 2: NEGATIVE

## 2020-07-07 LAB — SALICYLATE LEVEL: Salicylate Lvl: <7 mg/dL — ABNORMAL LOW (ref 7.0–30.0)

## 2020-07-07 MED ORDER — OLANZAPINE 5 MG PO TABS
7.5000 mg | ORAL_TABLET | Freq: Every day | ORAL | Status: DC
  Administered 2020-07-07 – 2020-07-08 (×2): 7.5 mg via ORAL
  Filled 2020-07-07 (×2): qty 2

## 2020-07-07 MED ORDER — DROPERIDOL 2.5 MG/ML IJ SOLN
2.5000 mg | Freq: Once | INTRAMUSCULAR | Status: DC
  Administered 2020-07-07: 2.5 mg via INTRAMUSCULAR

## 2020-07-07 MED ORDER — ALUM & MAG HYDROXIDE-SIMETH 200-200-20 MG/5ML PO SUSP
30.0000 mL | Freq: Once | ORAL | Status: AC
  Administered 2020-07-07: 30 mL via ORAL
  Filled 2020-07-07: qty 30

## 2020-07-07 MED ORDER — ACETAMINOPHEN 500 MG PO TABS
1000.0000 mg | ORAL_TABLET | Freq: Once | ORAL | Status: AC
  Administered 2020-07-07: 1000 mg via ORAL
  Filled 2020-07-07: qty 2

## 2020-07-07 MED ORDER — LACTATED RINGERS IV BOLUS
1000.0000 mL | Freq: Once | INTRAVENOUS | Status: AC
  Administered 2020-07-07: 1000 mL via INTRAVENOUS

## 2020-07-07 MED ORDER — DROPERIDOL 2.5 MG/ML IJ SOLN: 2.5000 mg | Freq: Once | INTRAMUSCULAR | Status: DC

## 2020-07-07 MED ORDER — POTASSIUM CHLORIDE 20 MEQ PO PACK
40.0000 meq | PACK | Freq: Once | ORAL | Status: AC
  Administered 2020-07-07: 40 meq via ORAL
  Filled 2020-07-07: qty 2

## 2020-07-07 MED ORDER — LIDOCAINE VISCOUS HCL 2 % MT SOLN
15.0000 mL | Freq: Once | OROMUCOSAL | Status: AC
  Administered 2020-07-07: 15 mL via ORAL
  Filled 2020-07-07: qty 15

## 2020-07-07 NOTE — Consult Note (Signed)
[facility] Face-to-Face Psychiatry Consult   Reason for Consult: paranoia and mood issues from Cocaine use  Repeat from last month  Referring Physician:  ED MD  Patient Identification: Steven Khan MRN:  [id number] Principal Diagnosis: <principal problem not specified> Diagnosis:  Active Problems:   * No active hospital problems. 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 male patient admitted with cocaine dependence who came in in a similar manner ---paranoid fearful and positive for cocaine.  Police brought him here out of concern of possible mental issues  He was just here last month for the same problem   Admitted to Psych then transferred to medicine for complaints of abdominal pain   Negative findings --sent back to Psych with endoscopy possible outpatient follow up   He relapsed did not follow directions and again the same cycle in   He has major depression and related symptoms but is not suicidal or homicidal He has generalized anxiety as well  He says his paranoia and fear had cleared   Currently on IIVC     HPI:   See above   Past Psychiatric History:  As above   Risk to Self: Suicidal Ideation: No Suicidal Intent: No Is patient at risk for suicide?: No Suicidal Plan?: No Specify Current Suicidal Plan: None Access to Means: No What has been your use of drugs/alcohol within the last 12 months?: Cocaine, Marijuana, Alcohol Other Self Harm Risks: None Triggers for Past Attempts: None known Intentional Self Injurious Behavior: None Risk to Others: Homicidal Ideation: No Thoughts of Harm to Others: No Current Homicidal Intent: No Current Homicidal Plan: No Access to Homicidal Means: No Identified Victim: None History of harm to others?: No Assessment of Violence: None Noted Violent  Behavior Description: None Does patient have access to weapons?: No Criminal Charges Pending?: No Does patient have a court date: No Prior Inpatient Therapy: Prior Inpatient Therapy: Yes Prior Therapy Dates: 06/04/20, 05/31/20 Prior Therapy Facilty/Provider(s): [facility] Reason for Treatment: Depression Prior Outpatient Therapy: Prior Outpatient Therapy: No Does patient have an ACCT team?: No Does patient have Intensive In-House Services?  : No Does patient have [facility] services? : No Does patient have P4CC services?: No  Past Medical History: History reviewed. No pertinent past medical history.  Past Surgical History:  Procedure Laterality Date  . HERNIA REPAIR     Family History: History reviewed. No pertinent family history. Family Psychiatric  History:  Parents with depression  Estranged from family due to substances  Social History:  Social History   Substance and Sexual Activity  Alcohol Use Yes     Social History   Substance and Sexual Activity  Drug Use Yes  . Types: Cocaine, Marijuana   Comment: ecstasy    Social History   Socioeconomic History  . Marital status: Divorced    Spouse name: Not on file  . Number of children: Not on file  . Years of education: Not on file  . Highest education level: Not on file  Occupational History  . Not on file  Tobacco Use  . Smoking status: Current Every Day Smoker  . Smokeless tobacco: Never Used  Substance and Sexual Activity  . Alcohol use: Yes  . Drug use: Yes    Types: Cocaine, Marijuana    Comment: ecstasy  .  Sexual activity: Not on file  Other Topics Concern  . Not on file  Social History Narrative  . Not on file   Social Determinants of Health   Financial Resource Strain:   . Difficulty of Paying Living Expenses: Not on file  Food Insecurity:   . Worried About Programme researcher, broadcasting/film/videounning Out of Food in the Last Year: Not on file  . Ran Out of Food in the Last Year: Not on file  Transportation Needs:   . Lack of  Transportation (Medical): Not on file  . Lack of Transportation (Non-Medical): Not on file  Physical Activity:   . Days of Exercise per Week: Not on file  . Minutes of Exercise per Session: Not on file  Stress:   . Feeling of Stress : Not on file  Social Connections:   . Frequency of Communication with Friends and Family: Not on file  . Frequency of Social Gatherings with Friends and Family: Not on file  . Attends Religious Services: Not on file  . Active Member of Clubs or Organizations: Not on file  . Attends BankerClub or Organization Meetings: Not on file  . Marital Status: Not on file   Additional Social History:  Not clear if he is committed to formal recovery program and regular follow up     Allergies:  No Known Allergies  Labs:  Results for orders placed or performed during the hospital encounter of 07/07/20 (from the past 48 hour(s))  Comprehensive metabolic panel     Status: Abnormal   Collection Time: 07/07/20  3:57 AM  Result Value Ref Range   Sodium 137 135 - 145 mmol/L   Potassium 3.7 3.5 - 5.1 mmol/L   Chloride 99 98 - 111 mmol/L   CO2 19 (L) 22 - 32 mmol/L   Glucose, Bld 144 (H) 70 - 99 mg/dL    Comment: Glucose reference range applies only to samples taken after fasting for at least 8 hours.   BUN 12 6 - 20 mg/dL   Creatinine, Ser 1.611.50 (H) 0.61 - 1.24 mg/dL   Calcium 9.1 8.9 - 09.610.3 mg/dL   Total Protein 7.5 6.5 - 8.1 g/dL   Albumin 4.6 3.5 - 5.0 g/dL   AST 36 15 - 41 U/L   ALT 16 0 - 44 U/L   Alkaline Phosphatase 47 38 - 126 U/L   Total Bilirubin 1.3 (H) 0.3 - 1.2 mg/dL   GFR calc non Af Amer 58 (L) >60 mL/min   GFR calc Af Amer >60 >60 mL/min   Anion gap 19 (H) 5 - 15    Comment: Performed at [facility], [address] [place], [place] [address]  Ethanol     Status: None   Collection Time: 07/07/20  3:57 AM  Result Value Ref Range   Alcohol, Ethyl (B) <10 <10 mg/dL    Comment: (NOTE) Lowest detectable limit for serum alcohol is 10  mg/dL.  For medical purposes only. Performed at [facility], [address] [place], [place] [id number]   cbc     Status: Abnormal   Collection Time: 07/07/20  3:57 AM  Result Value Ref Range   WBC 11.2 (H) 4.0 - 10.5 K/uL   RBC 4.53 4.22 - 5.81 MIL/uL   Hemoglobin 15.0 13.0 - 17.0 g/dL   HCT 14.743.4 39 - 52 %   MCV 95.8 80.0 - 100.0 fL   MCH 33.1 26.0 - 34.0 pg   MCHC 34.6 30.0 - 36.0 g/dL  RDW 13.9 11.5 - 15.5 %   Platelets 302 150 - 400 K/uL   nRBC 0.0 0.0 - 0.2 %    Comment: Performed at [facility], [address] [place], [place] [address]  Salicylate level     Status: Abnormal   Collection Time: 07/07/20  3:57 AM  Result Value Ref Range   Salicylate Lvl <7.0 (L) 7.0 - 30.0 mg/dL    Comment: Performed at [facility], [address] [place], [place] [address]  Acetaminophen level     Status: Abnormal   Collection Time: 07/07/20  3:57 AM  Result Value Ref Range   Acetaminophen (Tylenol), Serum <10 (L) 10 - 30 ug/mL    Comment: (NOTE) Therapeutic concentrations vary significantly. A range of 10-30 ug/mL  may be an effective concentration for many patients. However, some  are best treated at concentrations outside of this range. Acetaminophen concentrations >150 ug/mL at 4 hours after ingestion  and >50 ug/mL at 12 hours after ingestion are often associated with  toxic reactions.  Performed at [facility], [address] [place], [place] [address]   Lipase, blood     Status: None   Collection Time: 07/07/20  3:57 AM  Result Value Ref Range   Lipase 25 11 - 51 U/L    Comment: Performed at [facility], [address] [place], [place] [address]  SARS Coronavirus 2 by RT PCR (hospital order, performed in [facility] hospital lab) Nasopharyngeal Nasopharyngeal Swab     Status: None   Collection Time: 07/07/20  4:24 AM   Specimen: Nasopharyngeal Swab  Result Value Ref Range   SARS Coronavirus 2 NEGATIVE NEGATIVE     Comment: (NOTE) SARS-CoV-2 target nucleic acids are NOT DETECTED.  The SARS-CoV-2 RNA is generally detectable in upper and lower respiratory specimens during the acute phase of infection. The lowest concentration of SARS-CoV-2 viral copies this assay can detect is 250 copies / mL. A negative result does not preclude SARS-CoV-2 infection and should not be used as the sole basis for treatment or other patient management decisions.  A negative result may occur with improper specimen collection / handling, submission of specimen other than nasopharyngeal swab, presence of viral mutation(s) within the areas targeted by this assay, and inadequate number of viral copies (<250 copies / mL). A negative result must be combined with clinical observations, patient history, and epidemiological information.  Fact Sheet for Patients:   BoilerBrush.com.cy  Fact Sheet for Healthcare Providers: https://pope.com/  This test is not yet approved or  cleared by the [place] FDA and has been authorized for detection and/or diagnosis of SARS-CoV-2 by FDA under an Emergency Use Authorization (EUA).  This EUA will remain in effect (meaning this test can be used) for the duration of the COVID-19 declaration under Section 564(b)(1) of the Act, 21 U.S.C. section 360bbb-3(b)(1), unless the authorization is terminated or revoked sooner.  Performed at [facility], [address] [place], [place] [address]   Basic metabolic panel     Status: Abnormal   Collection Time: 07/07/20  6:31 AM  Result Value Ref Range   Sodium 137 135 - 145 mmol/L   Potassium 3.3 (L) 3.5 - 5.1 mmol/L   Chloride 101 98 - 111 mmol/L   CO2 25 22 - 32 mmol/L   Glucose, Bld 86 70 - 99 mg/dL    Comment: Glucose reference range applies only to samples taken after fasting for at least 8 hours.  BUN 12 6 - 20 mg/dL   Creatinine, Ser 7.61 (H) 0.61 - 1.24 mg/dL   Calcium 8.3  (L) 8.9 - 10.3 mg/dL   GFR calc non Af Amer >60 >60 mL/min   GFR calc Af Amer >60 >60 mL/min   Anion gap 11 5 - 15    Comment: Performed at [facility], [address] [place], [place] [address]    No current facility-administered medications for this encounter.   Current Outpatient Medications  Medication Sig Dispense Refill  . docusate sodium (COLACE) 100 MG capsule Take 2 capsules (200 mg total) by mouth 2 (two) times daily. 120 capsule 1  . mirtazapine (REMERON) 15 MG tablet Take 1 tablet (15 mg total) by mouth at bedtime. 30 tablet 1  . pantoprazole (PROTONIX) 40 MG tablet Take 1 tablet (40 mg total) by mouth 2 (two) times daily. 60 tablet 1  . polyethylene glycol (MIRALAX / GLYCOLAX) 17 g packet Take 17 g by mouth daily. 30 each 1  . promethazine (PHENERGAN) 12.5 MG tablet Take 1 tablet (12.5 mg total) by mouth every 6 (six) hours as needed for nausea or vomiting. 60 tablet 1    Musculoskeletal: Strength & Muscle Tone:   Normal       Gait & Station:  Normal  Patient leans: n/a   Psychiatric Specialty Exam: Physical Exam  Review of Systems  Blood pressure (!) 141/80, pulse 85, temperature 98.7 F (37.1 C), temperature source Oral, resp. rate 17, height 6\' 1"  (1.854 m), weight 95.3 kg, SpO2 100 %.Body mass index is 27.71 kg/m.  Mental Status exam   Alert angry agitated edgy frustrated AA male anxious Paranoid fearful  Oriented times four Consciousness not clouded or fluctuant Concentration and attention okay  Speech somewhat loud pressured SI and HI none  Judgement insight reliability fair to poor  Intelligence fund of knowledge declining No shakes tics tremors  Abstraction fair Memory remote recent immediate no major change Rapport and eye contact fair  Thought process and content --possibly paranoia and fear he said he was running away and broke through a window because he thought he was being robbed not clear if that is reality or part of temp  psychosis    Handedness not known Psychomotor normal Recall fair Cognition fair Language  ---english  Akathisia none Assets  Not clear  ADL's impaired when taking drugs homeless Sleep   Erratic Aims not done                                                                     Treatment Plan Summary:  AA male here for observation  He is still complaining of abdominal pain pending evaluation --at this time not suicidal or homicidal  Observe overnight -----general meds restarted pending disposition    Disposition:   Pending --technically homeless       , MD 07/07/2020 3:56 PM

## 2020-07-07 NOTE — Consult Note (Signed)
[facility] Face-to-Face Psychiatry Consult   Reason for Consult:   ON IVC same issues as last month  Cocaine intoxication, running from police  Came here because of acute stress and said he was being robbed, he allegedly jumped through a window and his legs are sore.  Was on Inpatient psych last month and was transferred to Medicine for dry wretching and possible coffee ground emesis   Endoscopy was supposed to be done ---but unclear outpatient follow up he is unreliable, did not follow th rough with previous suggestions.  Homeless and addicted  Last time did not need a sitter because he contracted for safety and was not suicidal Same issue again   I saw him in consult on floor   And while covering one day for Dr. Toni Amend    He has not taken meds or recovery suggestions.  He says to call his mom who may take him back home  Not clear yet   He is rude edgy and frustrated when just getting basic questions     Referring Physician:   ER MD  Patient Identification: Steven Khan MRN:  [id number] Principal Diagnosis: <principal problem not specified>   Diagnosis:  Cocaine intoxication Major depression  Personality disorder         Total Time spent with patient:  45-one hour  Subjective:   Steven Khan is a 39 y.o. male patient admitted with  See above   HPI:   See  Past Psychiatric History:    See other notes   Risk to Self: Suicidal Ideation: No Suicidal Intent: No Is patient at risk for suicide?: No Suicidal Plan?: No Specify Current Suicidal Plan: None Access to Means: No What has been your use of drugs/alcohol within the last 12 months?: Cocaine, Marijuana, Alcohol Other Self Harm Risks: None Triggers for Past Attempts: None known Intentional Self Injurious Behavior: None Risk to Others: Homicidal Ideation: No Thoughts of Harm to Others: No Current Homicidal Intent: No Current Homicidal Plan: No Access to Homicidal Means: No Identified Victim: None History of harm to  others?: No Assessment of Violence: None Noted Violent Behavior Description: None Does patient have access to weapons?: No Criminal Charges Pending?: No Does patient have a court date: No Prior Inpatient Therapy: Prior Inpatient Therapy: Yes Prior Therapy Dates: 06/04/20, 05/31/20 Prior Therapy Facilty/Provider(s): [facility] Reason for Treatment: Depression Prior Outpatient Therapy: Prior Outpatient Therapy: No Does patient have an ACCT team?: No Does patient have Intensive In-House Services?  : No Does patient have [facility] services? : No Does patient have P4CC services?: No  Past Medical History: History reviewed. No pertinent past medical history.  Past Surgical History:  Procedure Laterality Date  . HERNIA REPAIR     Family History: History reviewed. No pertinent family history. Family Psychiatric  History:  Mom with history of depression already recently noted  Social History:  Social History   Substance and Sexual Activity  Alcohol Use Yes     Social History   Substance and Sexual Activity  Drug Use Yes  . Types: Cocaine, Marijuana   Comment: ecstasy    Social History   Socioeconomic History  . Marital status: Divorced    Spouse name: Not on file  . Number of children: Not on file  . Years of education: Not on file  . Highest education level: Not on file  Occupational History  . Not on file  Tobacco Use  . Smoking status: Current Every Day Smoker  . Smokeless tobacco:  Never Used  Substance and Sexual Activity  . Alcohol use: Yes  . Drug use: Yes    Types: Cocaine, Marijuana    Comment: ecstasy  . Sexual activity: Not on file  Other Topics Concern  . Not on file  Social History Narrative  . Not on file   Social Determinants of Health   Financial Resource Strain:   . Difficulty of Paying Living Expenses: Not on file  Food Insecurity:   . Worried About Programme researcher, broadcasting/film/video in the Last Year: Not on file  . Ran Out of Food in the Last Year: Not on file   Transportation Needs:   . Lack of Transportation (Medical): Not on file  . Lack of Transportation (Non-Medical): Not on file  Physical Activity:   . Days of Exercise per Week: Not on file  . Minutes of Exercise per Session: Not on file  Stress:   . Feeling of Stress : Not on file  Social Connections:   . Frequency of Communication with Friends and Family: Not on file  . Frequency of Social Gatherings with Friends and Family: Not on file  . Attends Religious Services: Not on file  . Active Member of Clubs or Organizations: Not on file  . Attends Banker Meetings: Not on file  . Marital Status: Not on file   Additional Social History:    Allergies:  No Known Allergies  Labs:  Results for orders placed or performed during the hospital encounter of 07/07/20 (from the past 48 hour(s))  Comprehensive metabolic panel     Status: Abnormal   Collection Time: 07/07/20  3:57 AM  Result Value Ref Range   Sodium 137 135 - 145 mmol/L   Potassium 3.7 3.5 - 5.1 mmol/L   Chloride 99 98 - 111 mmol/L   CO2 19 (L) 22 - 32 mmol/L   Glucose, Bld 144 (H) 70 - 99 mg/dL    Comment: Glucose reference range applies only to samples taken after fasting for at least 8 hours.   BUN 12 6 - 20 mg/dL   Creatinine, Ser 3.47 (H) 0.61 - 1.24 mg/dL   Calcium 9.1 8.9 - 42.5 mg/dL   Total Protein 7.5 6.5 - 8.1 g/dL   Albumin 4.6 3.5 - 5.0 g/dL   AST 36 15 - 41 U/L   ALT 16 0 - 44 U/L   Alkaline Phosphatase 47 38 - 126 U/L   Total Bilirubin 1.3 (H) 0.3 - 1.2 mg/dL   GFR calc non Af Amer 58 (L) >60 mL/min   GFR calc Af Amer >60 >60 mL/min   Anion gap 19 (H) 5 - 15    Comment: Performed at [facility], [address] [place], [place] [address]  Ethanol     Status: None   Collection Time: 07/07/20  3:57 AM  Result Value Ref Range   Alcohol, Ethyl (B) <10 <10 mg/dL    Comment: (NOTE) Lowest detectable limit for serum alcohol is 10 mg/dL.  For medical purposes only. Performed at  [facility], [address] [place], [place] [address]   cbc     Status: Abnormal   Collection Time: 07/07/20  3:57 AM  Result Value Ref Range   WBC 11.2 (H) 4.0 - 10.5 K/uL   RBC 4.53 4.22 - 5.81 MIL/uL   Hemoglobin 15.0 13.0 - 17.0 g/dL   HCT 32.9 39 - 52 %   MCV 95.8 80.0 - 100.0 fL   MCH 33.1  26.0 - 34.0 pg   MCHC 34.6 30.0 - 36.0 g/dL   RDW 08.613.9 57.811.5 - 46.915.5 %   Platelets 302 150 - 400 K/uL   nRBC 0.0 0.0 - 0.2 %    Comment: Performed at [facility], [address] [place], [place] [address]  Salicylate level     Status: Abnormal   Collection Time: 07/07/20  3:57 AM  Result Value Ref Range   Salicylate Lvl <7.0 (L) 7.0 - 30.0 mg/dL    Comment: Performed at [facility], [address] [place], [place] [address]  Acetaminophen level     Status: Abnormal   Collection Time: 07/07/20  3:57 AM  Result Value Ref Range   Acetaminophen (Tylenol), Serum <10 (L) 10 - 30 ug/mL    Comment: (NOTE) Therapeutic concentrations vary significantly. A range of 10-30 ug/mL  may be an effective concentration for many patients. However, some  are best treated at concentrations outside of this range. Acetaminophen concentrations >150 ug/mL at 4 hours after ingestion  and >50 ug/mL at 12 hours after ingestion are often associated with  toxic reactions.  Performed at [facility], [address] [place], [place] [address]   Lipase, blood     Status: None   Collection Time: 07/07/20  3:57 AM  Result Value Ref Range   Lipase 25 11 - 51 U/L    Comment: Performed at [facility], [address] [place], [place] [address]  SARS Coronavirus 2 by RT PCR (hospital order, performed in [facility] hospital lab) Nasopharyngeal Nasopharyngeal Swab     Status: None   Collection Time: 07/07/20  4:24 AM   Specimen: Nasopharyngeal Swab  Result Value Ref Range   SARS Coronavirus 2 NEGATIVE NEGATIVE    Comment: (NOTE) SARS-CoV-2 target nucleic acids  are NOT DETECTED.  The SARS-CoV-2 RNA is generally detectable in upper and lower respiratory specimens during the acute phase of infection. The lowest concentration of SARS-CoV-2 viral copies this assay can detect is 250 copies / mL. A negative result does not preclude SARS-CoV-2 infection and should not be used as the sole basis for treatment or other patient management decisions.  A negative result may occur with improper specimen collection / handling, submission of specimen other than nasopharyngeal swab, presence of viral mutation(s) within the areas targeted by this assay, and inadequate number of viral copies (<250 copies / mL). A negative result must be combined with clinical observations, patient history, and epidemiological information.  Fact Sheet for Patients:   BoilerBrush.com.cyhttps://www.fda.gov/media/136312/download  Fact Sheet for Healthcare Providers: https://pope.com/https://www.fda.gov/media/136313/download  This test is not yet approved or  cleared by the [place] FDA and has been authorized for detection and/or diagnosis of SARS-CoV-2 by FDA under an Emergency Use Authorization (EUA).  This EUA will remain in effect (meaning this test can be used) for the duration of the COVID-19 declaration under Section 564(b)(1) of the Act, 21 U.S.C. section 360bbb-3(b)(1), unless the authorization is terminated or revoked sooner.  Performed at [facility], [address] [place], [place] [address]   Basic metabolic panel     Status: Abnormal   Collection Time: 07/07/20  6:31 AM  Result Value Ref Range   Sodium 137 135 - 145 mmol/L   Potassium 3.3 (L) 3.5 - 5.1 mmol/L   Chloride 101 98 - 111 mmol/L   CO2 25 22 - 32 mmol/L   Glucose, Bld 86 70 - 99 mg/dL    Comment: Glucose reference range  applies only to samples taken after fasting for at least 8 hours.   BUN 12 6 - 20 mg/dL   Creatinine, Ser 0.62 (H) 0.61 - 1.24 mg/dL   Calcium 8.3 (L) 8.9 - 10.3 mg/dL   GFR calc non Af Amer >60  >60 mL/min   GFR calc Af Amer >60 >60 mL/min   Anion gap 11 5 - 15    Comment: Performed at [facility], [address] [place], [place] [address]    No current facility-administered medications for this encounter.   Current Outpatient Medications  Medication Sig Dispense Refill  . docusate sodium (COLACE) 100 MG capsule Take 2 capsules (200 mg total) by mouth 2 (two) times daily. 120 capsule 1  . mirtazapine (REMERON) 15 MG tablet Take 1 tablet (15 mg total) by mouth at bedtime. 30 tablet 1  . pantoprazole (PROTONIX) 40 MG tablet Take 1 tablet (40 mg total) by mouth 2 (two) times daily. 60 tablet 1  . polyethylene glycol (MIRALAX / GLYCOLAX) 17 g packet Take 17 g by mouth daily. 30 each 1  . promethazine (PHENERGAN) 12.5 MG tablet Take 1 tablet (12.5 mg total) by mouth every 6 (six) hours as needed for nausea or vomiting. 60 tablet 1    Musculoskeletal: Strength & Muscle Tone:  Normal  Gait & Station: normal but says he has leg pain  Patient leans: n/a   Psychiatric Specialty Exam: Physical Exam  Review of Systems  Blood pressure (!) 141/80, Khan 85, temperature 98.7 F (37.1 C), temperature source Oral, resp. rate 17, height 6\' 1"  (1.854 m), weight 95.3 kg, SpO2 100 %.Body mass index is 27.71 kg/m.    Mental Status --same as last admission  Oriented times four Rapport fair to poor Eye contact strange Consciousness not clouded or fluctuant Concentration and attention fair Mood and affect --irritable edgy frustrated SI and HI --vague  Memory normal Fund of knowledge, intelligence, judgement insight reliability all poor  Movements ---no shakes tics and tremors Abstraction fair  Though process and content --not clear if he was actually being robbed and ran away or if this was paranoia under cocaine influence In either case police were suspicious found him running in acute stress and brought him here Speech no acute change                                                       Language, recall handedness aims all normal Assets not knonw ADL's normal  Cognition poor Sleep no change  Recall --fair Handedness not known  Akathisia none  Psychomotor activity normal       Treatment Plan Summary:  Overnight observation --he previously had GI pain and was seen by them with supposed outpatient follow up for endoscopy   Was on Psych then transferred to medicine---GI did see for emesis and wretching supposedly with coffee ground susbstance---  Sent back to Psych and discharged but seems to have remained homeless relapsed did not follow through and is in addictive dysfunction   Disposition:\ over night obs with basic HS Zyprexa as med   Then we will see in am   I informed DR. about the past GI history that perhaps also needs follow up here on this visit too.  Katrinka Blazing, MD 07/07/2020 4:16 PM

## 2020-07-07 NOTE — ED Notes (Signed)
Pt refused dinner tray

## 2020-07-07 NOTE — ED Notes (Addendum)
Pt belongings:  Belt, jeans, socks, t-shirt, black underwear, cell phone, earrings, lighter, wallet, chapstick, wine stopper

## 2020-07-07 NOTE — ED Notes (Signed)
Three blankets placed on patient per request

## 2020-07-07 NOTE — ED Provider Notes (Signed)
[facility] Emergency Department Provider Note  ____________________________________________   None    (approximate)  I have reviewed the triage vital signs and the nursing notes.   HISTORY  Chief Complaint Psychiatric Evaluation   HPI Steven Khan is a 39 y.o. male with a past medical history of severe recurrent depression with psychotic features, PTSD, cocaine abuse, EtOH abuse, and recurrent abdominal pain of unknown etiology who presents accompanied by police after they filled out IVC paperwork against him after he was found running up and down the street screaming that someone was trying to kill him.  Per patient he states that someone was trying to kill him and he was running away.  He is not sure who this person was or why they were trying to kill him.  Police did not see any pursue worse.  Patient denies any HI or hallucinations.  He does endorse using significant mount of cocaine and THC last night.  He states that he is sore everywhere from running and jumping but is not specifically sore in any specific extremity or joint.  He did have some vomiting today and has some epigastric abdominal pain which is very similar to pain he has had in the past.  He denies any headache, earache, sore throat, chest pain, cough, shortness of breath, diarrhea, dysuria, back pain, rash, or other acute physical symptoms.  No clear alleviating aggravating factors.  No prior similar episodes per patient.  He states he is not currently taking any psychiatric medications         History reviewed. No pertinent past medical history.  Patient Active Problem List   Diagnosis Date Noted  . Severe recurrent major depression without psychotic features (HCC) 06/05/2020  . Suicidal ideations 06/02/2020  . PTSD (post-traumatic stress disorder) 06/01/2020  . Alcohol abuse 06/01/2020  . Cocaine abuse (HCC) 06/01/2020  . Abdominal pain 06/01/2020  . Involuntary commitment  06/01/2020  . Vomiting 06/01/2020  . Intractable abdominal pain 06/01/2020  . Depression, major, recurrent, severe with psychosis (HCC) 05/31/2020    Past Surgical History:  Procedure Laterality Date  . HERNIA REPAIR      Prior to Admission medications   Medication Sig Start Date End Date Taking? Authorizing Provider  docusate sodium (COLACE) 100 MG capsule Take 2 capsules (200 mg total) by mouth 2 (two) times daily. 06/06/20   Clapacs, Jackquline Denmark, MD  mirtazapine (REMERON) 15 MG tablet Take 1 tablet (15 mg total) by mouth at bedtime. 06/06/20   Clapacs, Jackquline Denmark, MD  pantoprazole (PROTONIX) 40 MG tablet Take 1 tablet (40 mg total) by mouth 2 (two) times daily. 06/06/20   Clapacs, Jackquline Denmark, MD  polyethylene glycol (MIRALAX / GLYCOLAX) 17 g packet Take 17 g by mouth daily. 06/07/20   Clapacs, Jackquline Denmark, MD  promethazine (PHENERGAN) 12.5 MG tablet Take 1 tablet (12.5 mg total) by mouth every 6 (six) hours as needed for nausea or vomiting. 06/06/20   Clapacs, Jackquline Denmark, MD    Allergies Patient has no known allergies.  History reviewed. No pertinent family history.  Social History Social History   Tobacco Use  . Smoking status: Current Every Day Smoker  . Smokeless tobacco: Never Used  Substance Use Topics  . Alcohol use: Yes  . Drug use: Yes    Types: Cocaine, Marijuana    Comment: ecstasy    Review of Systems  Review of Systems  Constitutional: Negative for chills and fever.  HENT: Negative for sore throat.  Eyes: Negative for pain.  Respiratory: Negative for cough and stridor.   Cardiovascular: Negative for chest pain.  Gastrointestinal: Positive for vomiting.  Genitourinary: Negative for dysuria.  Musculoskeletal: Positive for myalgias.  Skin: Negative for rash.  Neurological: Negative for seizures, loss of consciousness and headaches.  Psychiatric/Behavioral: Positive for substance abuse. Negative for depression and suicidal ideas. The patient has insomnia.   All other systems  reviewed and are negative.     ____________________________________________   PHYSICAL EXAM:  VITAL SIGNS: ED Triage Vitals  Enc Vitals Group     BP 07/07/20 0351 121/65     Pulse Rate 07/07/20 0351 (!) 115     Resp 07/07/20 0351 18     Temp 07/07/20 0351 97.6 F (36.4 C)     Temp Source 07/07/20 0351 Oral     SpO2 07/07/20 0351 95 %     Weight 07/07/20 0352 210 lb (95.3 kg)     Height 07/07/20 0352 6\' 1"  (1.854 m)     Head Circumference --      Peak Flow --      Pain Score 07/07/20 0352 10     Pain Loc --      Pain Edu? --      Excl. in GC? --    Vitals:   07/07/20 0351 07/07/20 0522  BP: 121/65 (!) 153/73  Pulse: (!) 115 86  Resp: 18 18  Temp: 97.6 F (36.4 C)   SpO2: 95% 99%   Physical Exam Vitals and nursing note reviewed.  Constitutional:      Appearance: He is well-developed.  HENT:     Head: Normocephalic and atraumatic.     Right Ear: External ear normal.     Left Ear: External ear normal.     Nose: Nose normal.  Eyes:     Conjunctiva/sclera: Conjunctivae normal.  Cardiovascular:     Rate and Rhythm: Normal rate and regular rhythm.     Heart sounds: No murmur heard.   Pulmonary:     Effort: Pulmonary effort is normal. No respiratory distress.     Breath sounds: Normal breath sounds.  Abdominal:     Palpations: Abdomen is soft.     Tenderness: There is no abdominal tenderness.  Musculoskeletal:     Cervical back: Neck supple.  Skin:    General: Skin is warm and dry.  Neurological:     Mental Status: He is alert and oriented to person, place, and time.  Psychiatric:        Mood and Affect: Affect is flat.        Speech: Speech is not rapid and pressured.        Behavior: Behavior is not agitated or aggressive.        Thought Content: Thought content is paranoid. Thought content does not include homicidal or suicidal ideation.     2+ bilateral radial and DP pulses.  Patient has full strength of his bilateral extremities.  There is no focal  areas of tenderness, joint effusions, or overlying skin changes of the bilateral hips, knees, or ankles.  No other obvious evidence of trauma to the bilateral lower extremities. ____________________________________________   LABS (all labs ordered are listed, but only abnormal results are displayed)  Labs Reviewed  COMPREHENSIVE METABOLIC PANEL - Abnormal; Notable for the following components:      Result Value   CO2 19 (*)    Glucose, Bld 144 (*)    Creatinine, Ser 1.50 (*)  Total Bilirubin 1.3 (*)    GFR calc non Af Amer 58 (*)    Anion gap 19 (*)    All other components within normal limits  CBC - Abnormal; Notable for the following components:   WBC 11.2 (*)    All other components within normal limits  SALICYLATE LEVEL - Abnormal; Notable for the following components:   Salicylate Lvl <7.0 (*)    All other components within normal limits  ACETAMINOPHEN LEVEL - Abnormal; Notable for the following components:   Acetaminophen (Tylenol), Serum <10 (*)    All other components within normal limits  SARS CORONAVIRUS 2 BY RT PCR (HOSPITAL ORDER, PERFORMED IN [facility])  ETHANOL  LIPASE, BLOOD  URINE DRUG SCREEN, QUALITATIVE ([facility] ONLY)  BASIC METABOLIC PANEL   ____________________________________________  ____________________________________________   PROCEDURES  Procedure(s) performed (including Critical Care):  Procedures   ____________________________________________   INITIAL IMPRESSION / ASSESSMENT AND PLAN / ED COURSE        Patient presents with [place] to history exam after police fill out IVC paperwork with concerns for paranoid delusions in the setting of substance abuse.  History and exam as above.  Patient is afebrile hemodynamically stable on arrival.  Exam as above without evidence of significant injury to the bilateral extremities.  Routine psych labs obtained remarkable for slightly elevated WBC count as well as evidence of an AKI with a  creatinine of 1.5 compared to creatinine of 1.02 obtained 1 month ago.  Patient is also noted to have a CO2 of 19 and anion gap of 19.  I suspect these are related to some dehydration in the setting of vomiting and substance abuse.  I will hydrate patient with 1 L of LR and reassess these with a BMP after fluid bolus.  Remainder of routine psych labs unremarkable.  Given chronicity of patient's abdominal pain and otherwise no evidence of acute pancreatitis or cholestasis on labs a low suspicion for acute appendicitis, cholecystitis, perforated viscus, or other immediately after any intra-abdominal pathology.  Psychiatry and TTS services consulted due to concerns for paranoia in the setting of polysubstance abuse and unsafe behavior with patient running up and down the street.  The patient has been placed in psychiatric observation due to the need to provide a safe environment for the patient while obtaining psychiatric consultation and evaluation, as well as ongoing medical and medication management to treat the patient's condition.  The patient has been placed under full IVC at this time.   ____________________________________________   FINAL CLINICAL IMPRESSION(S) / ED DIAGNOSES  Final diagnoses:  AKI (acute kidney injury) (HCC)  Dehydration  Behavior concern  Substance abuse (HCC)    Medications  lactated ringers bolus 1,000 mL (1,000 mLs Intravenous New Bag/Given 07/07/20 0542)  alum & mag hydroxide-simeth (MAALOX/MYLANTA) 200-200-20 MG/5ML suspension 30 mL (30 mLs Oral Given 07/07/20 0545)     ED Discharge Orders    None       Note:  This document was prepared using Dragon voice recognition software and may include unintentional dictation errors.   Gilles Chiquito, MD 07/07/20 [phone]

## 2020-07-07 NOTE — ED Notes (Signed)
Patient given apple juice

## 2020-07-07 NOTE — ED Notes (Signed)
Pt given new scrubs, underwear, and toothbrush/toothpaste.

## 2020-07-07 NOTE — ED Triage Notes (Signed)
Pt arrived via [employer] with IVC paperwork, per paperwork pt was running up and down the street in [place] and stated to [facility] that people were trying to kill him. Per paperwork pt has hx of narcotics use and hx of mental illness.  On arrival pt does appear paranoid and vomited in the lobby as well.

## 2020-07-07 NOTE — Progress Notes (Signed)
Patient ID: Steven Khan, male   DOB: 01-07-1981, 39 y.o.   MRN: [id number]    Brief Psychiatry Note  Patient is currently asleep but chart reviewed  History of cocaine and meth and other polysubstance dependence, post intoxication---History of Major Depression with psychosis admitted to our unit last month  Vitals stable so far   Now currently asleep   Will come back after he wakes up     UDS positive for cocaine and MJ Ethanol level  Less than 10   Rama Candise Bowens MD Psychiatry

## 2020-07-07 NOTE — BH Assessment (Signed)
Assessment Note  Steven Khan is an 39 y.o. male presenting to [facility] ED under IVC. Per triage note Pt arrived via [employer] with IVC paperwork, per paperwork pt was running up and down the street in [place] and stated to [facility] that people were trying to kill him. Per paperwork pt has hx of narcotics use and hx of mental illness. On arrival pt does appear paranoid and vomited in the lobby as well. During assessment patient appears alert and oriented x4, calm and cooperative. When asked if patient understands why he was brought to the ED patient reported "the police brought me here because I was running for my life, I don't know who they were but I'm not crazy." Patient reports a mental health history when he was in prison "they diagnosed me OCD and depression but I don't know why, I've had depression but I never acted on wanting to hurt myself." Patient's affect appears flat and sad and patient continues to be paranoid. Patient does report a substance abuse history "marijauana and for the last 3 days Cocaine." Patient reported amounts of Cocaine "a little bit here or there" via inhaling. Patient also reports marijuana use "daily, about 3-4 blunts." Patient UDS currently unavailable. Patient has a previous admission with [facility] for Depression with psychosis recently in 8/17 and 8/13 "I checked myself in for depression."   Patient disposition pending, patient to be seen by Psyc Provider  Diagnosis: Cocaine Abuse, Cannabis Abuse, Depression with psychosis by hx  Past Medical History: History reviewed. No pertinent past medical history.  Past Surgical History:  Procedure Laterality Date  . HERNIA REPAIR      Family History: History reviewed. No pertinent family history.  Social History:  reports that he has been smoking. He has never used smokeless tobacco. He reports current alcohol use. He reports current drug use. Drugs: Cocaine and Marijuana.  Additional Social History:  Alcohol / Drug Use Pain  Medications: See MAR Prescriptions: See MAR Over the Counter: See MAR History of alcohol / drug use?: Yes Substance #1 Name of Substance 1: Cocaine Substance #2 Name of Substance 2: Marijuana  CIWA: CIWA-Ar BP: (!) 153/73 Pulse Rate: 86 COWS:    Allergies: No Known Allergies  Home Medications: (Not in a hospital admission)   OB/GYN Status:  No LMP for male patient.  General Assessment Data Location of Assessment: [facility] ED TTS Assessment: In system Is this a Tele or Face-to-Face Assessment?: Face-to-Face Is this an Initial Assessment or a Re-assessment for this encounter?: Initial Assessment Patient Accompanied by:: N/A Language Other than English: No Living Arrangements: Homeless/Shelter What gender do you identify as?: Male Marital status: Single Pregnancy Status: No Living Arrangements: Other (Comment) Can pt return to current living arrangement?: Yes Admission Status: Involuntary Petitioner: Police Is patient capable of signing voluntary admission?: No Referral Source: Other Insurance type: None  Medical Screening Exam [facility] Walk-in ONLY) Medical Exam completed: Yes  Crisis Care Plan Living Arrangements: Other (Comment) Legal Guardian: Other: (Self) Name of Psychiatrist: None Name of Therapist: None  Education Status Is patient currently in school?: No Is the patient employed, unemployed or receiving disability?: Unemployed  Risk to self with the past 6 months Suicidal Ideation: No Has patient been a risk to self within the past 6 months prior to admission? : No Suicidal Intent: No Has patient had any suicidal intent within the past 6 months prior to admission? : No Is patient at risk for suicide?: No Suicidal Plan?: No Has patient had  any suicidal plan within the past 6 months prior to admission? : No Specify Current Suicidal Plan: None Access to Means: No What has been your use of drugs/alcohol within the last 12 months?: Cocaine, Marijuana,  Alcohol Previous Attempts/Gestures: No Other Self Harm Risks: None Triggers for Past Attempts: None known Intentional Self Injurious Behavior: None Family Suicide History: Unknown Recent stressful life event(s): Other (Comment) (Currently no stable place to live) Persecutory voices/beliefs?: No Depression: No Substance abuse history and/or treatment for substance abuse?: Yes Suicide prevention information given to non-admitted patients: Not applicable  Risk to Others within the past 6 months Homicidal Ideation: No Does patient have any lifetime risk of violence toward others beyond the six months prior to admission? : No Thoughts of Harm to Others: No Current Homicidal Intent: No Current Homicidal Plan: No Access to Homicidal Means: No Identified Victim: None History of harm to others?: No Assessment of Violence: None Noted Violent Behavior Description: None Does patient have access to weapons?: No Criminal Charges Pending?: No Does patient have a court date: No Is patient on probation?: No  Psychosis Hallucinations: None noted Delusions: Persecutory  Mental Status Report Appearance/Hygiene: In scrubs Eye Contact: Fair Motor Activity: Freedom of movement Speech: Logical/coherent Level of Consciousness: Alert Mood: Sad Affect: Flat Anxiety Level: None Thought Processes: Coherent Judgement: Partial Orientation: Person, Place, Time, Situation Obsessive Compulsive Thoughts/Behaviors: None  Cognitive Functioning Concentration: Normal Memory: Recent Intact, Remote Intact Is patient IDD: No Insight: Fair Impulse Control: Fair Appetite: Fair Have you had any weight changes? : No Change Sleep: No Change Total Hours of Sleep: 5 Vegetative Symptoms: None  ADLScreening [facility] Assessment Services) Patient's cognitive ability adequate to safely complete daily activities?: Yes Patient able to express need for assistance with ADLs?: Yes Independently performs ADLs?: Yes  (appropriate for developmental age)  Prior Inpatient Therapy Prior Inpatient Therapy: Yes Prior Therapy Dates: 06/04/20, 05/31/20 Prior Therapy Facilty/Provider(s): [facility] Reason for Treatment: Depression  Prior Outpatient Therapy Prior Outpatient Therapy: No Does patient have an ACCT team?: No Does patient have Intensive In-House Services?  : No Does patient have [facility] services? : No Does patient have P4CC services?: No  ADL Screening (condition at time of admission) Patient's cognitive ability adequate to safely complete daily activities?: Yes Is the patient deaf or have difficulty hearing?: No Does the patient have difficulty seeing, even when wearing glasses/contacts?: No Does the patient have difficulty concentrating, remembering, or making decisions?: No Patient able to express need for assistance with ADLs?: Yes Does the patient have difficulty dressing or bathing?: No Independently performs ADLs?: Yes (appropriate for developmental age) Does the patient have difficulty walking or climbing stairs?: No Weakness of Legs: None Weakness of Arms/Hands: None  Home Assistive Devices/Equipment Home Assistive Devices/Equipment: None  Therapy Consults (therapy consults require a physician order) PT Evaluation Needed: No OT Evalulation Needed: No SLP Evaluation Needed: No Abuse/Neglect Assessment (Assessment to be complete while patient is alone) Abuse/Neglect Assessment Can Be Completed: Yes Physical Abuse: Denies Verbal Abuse: Denies Sexual Abuse: Denies Exploitation of patient/patient's resources: Denies Self-Neglect: Denies Values / Beliefs Cultural Requests During Hospitalization: None Spiritual Requests During Hospitalization: None Consults Spiritual Care Consult Needed: No Transition of Care Team Consult Needed: No Advance Directives (For Healthcare) Does Patient Have a Medical Advance Directive?: No Would patient like information on creating a medical advance  directive?: No - Patient declined          Disposition: Patient disposition pending, patient to be seen by Psyc Provider Disposition Initial  Assessment Completed for this Encounter: Yes  On Site Evaluation by:   Reviewed with Physician:    Benay Pike MS LCASA 07/07/2020 6:07 AM

## 2020-07-07 NOTE — ED Notes (Signed)
Pt requesting morphine for his pain. Agrees to try other pain meds first. Will notify MD.

## 2020-07-07 NOTE — ED Notes (Signed)
Took Pt gingerale and italian ice. Passed on information about him being in pain to the nurse.

## 2020-07-08 MED ORDER — ONDANSETRON 4 MG PO TBDP
4.0000 mg | ORAL_TABLET | Freq: Once | ORAL | Status: AC
  Administered 2020-07-08: 4 mg via ORAL
  Filled 2020-07-08: qty 1

## 2020-07-08 MED ORDER — ALUM & MAG HYDROXIDE-SIMETH 200-200-20 MG/5ML PO SUSP
30.0000 mL | Freq: Once | ORAL | Status: AC
  Administered 2020-07-08: 30 mL via ORAL
  Filled 2020-07-08: qty 30

## 2020-07-08 MED ORDER — FAMOTIDINE 20 MG PO TABS
20.0000 mg | ORAL_TABLET | Freq: Two times a day (BID) | ORAL | Status: DC
  Administered 2020-07-08 – 2020-07-09 (×3): 20 mg via ORAL
  Filled 2020-07-08 (×3): qty 1

## 2020-07-08 MED ORDER — LIDOCAINE VISCOUS HCL 2 % MT SOLN
15.0000 mL | Freq: Once | OROMUCOSAL | Status: AC
  Administered 2020-07-08: 15 mL via ORAL
  Filled 2020-07-08: qty 15

## 2020-07-08 MED ORDER — ACETAMINOPHEN 500 MG PO TABS
1000.0000 mg | ORAL_TABLET | Freq: Once | ORAL | Status: AC
  Administered 2020-07-08: 1000 mg via ORAL
  Filled 2020-07-08: qty 2

## 2020-07-08 NOTE — ED Notes (Signed)
Pt found sleeping on floor. Pt informed RN this is more comfortable for him and he would like more pain meds, states the backs of his legs still hurt from jumping 2 stories and is still having nausea/abd pain cramping.

## 2020-07-08 NOTE — ED Provider Notes (Signed)
Emergency Medicine Observation Re-evaluation Note  Steven Khan is a 39 y.o. male, seen on rounds today.  Pt initially presented to the ED for complaints of Psychiatric Evaluation Currently, the patient is resting comfortably without any new complaints.  Physical Exam  BP 138/88 (BP Location: Right Arm)   Pulse 75   Temp 98.4 F (36.9 C) (Oral)   Resp 17   Ht 6\' 1"  (1.854 m)   Wt 95.3 kg   SpO2 100%   BMI 27.71 kg/m  Physical Exam General: Resting comfortably Cardiac: No cyanosis Lungs: Equal rise and fall Psych: Normal  ED Course / MDM  EKG:    I have reviewed the labs performed to date as well as medications administered while in observation.  No recent changes in the last 24 hours  Plan  Current plan is for placement. Patient is under full IVC at this time.   , MD 07/08/20 [phone]

## 2020-07-08 NOTE — ED Notes (Signed)
Gave food tray with juice. 

## 2020-07-08 NOTE — ED Notes (Addendum)
Pt refused tylenol for abdominal pain. Spit it out and states "i'm not taking this shit", behavior appears to be getting agitated.  States he has been given morphine in the past which "took away all the pain" Dr Vicente Males made aware

## 2020-07-08 NOTE — ED Notes (Addendum)
Steven Khan at bedside at this time

## 2020-07-08 NOTE — ED Notes (Signed)
French Ana NT notified this RN of pt laying in floor. Pt denies falling, reports laying in floor voluntarily because coolness of floor helps with his abdominal pain.  When this RN went to bedside, pt sitting in chair, denied once again that he fell, reports voluntarily laying in floor.  MD Scotty Court aware of abdominal pain Pt denies SI/HI Given breakfast tray

## 2020-07-08 NOTE — ED Notes (Signed)
Pt called this RN to bedside, states he has been nauseous and just vomited.  Verbal ordered 4mg  PO zofran MD

## 2020-07-08 NOTE — ED Notes (Signed)
Pt c/o continued abdominal pain. Dr Vicente Males notified and to place orders

## 2020-07-08 NOTE — ED Notes (Signed)
Pt calling out again due to stomach hurting and leg pain. Pt still refusing tylenol. MD made aware

## 2020-07-08 NOTE — ED Notes (Signed)
IVC, pend dispo 

## 2020-07-08 NOTE — ED Notes (Addendum)
Pt standing in room. Reports upper R back pain and states "I don't know why I can't just get something stronger". RN informed pt he received tylenol and is now getting pepcid for stomach relief. Pt states understanding. Pt standing at toilet reports emesis, none observed

## 2020-07-08 NOTE — ED Notes (Signed)
Pt requesting shower. Pt was given clean scrubs and supplies to take a shower. Pt is able to shower independently.

## 2020-07-08 NOTE — ED Notes (Signed)
Pt given ice water and freeze pop, took meds and was able to hold down GI cocktail.

## 2020-07-08 NOTE — ED Notes (Signed)
IVC/pending overnight obs 

## 2020-07-08 NOTE — Progress Notes (Signed)
Patient ID: Steven Khan, male   DOB: 1980/11/13, 39 y.o.   MRN: [id number]     I tried to see patient but he was asleep and was not answering   I asked him about abdominal pain and if he is actively suicidal or homicidal   I have to return later  However most likely will be psych cleared With need for referrals to shelters   ER MD to decide degree of intervention of abdominal pain and all as with last psych and medical admit   Prior to either admission or discharge   Rama Candise Bowens MD

## 2020-07-09 DIAGNOSIS — F1494 Cocaine use, unspecified with cocaine-induced mood disorder: Secondary | ICD-10-CM | POA: Diagnosis present

## 2020-07-09 MED ORDER — FAMOTIDINE 20 MG PO TABS
20.0000 mg | ORAL_TABLET | Freq: Two times a day (BID) | ORAL | 0 refills | Status: DC
Start: 2020-07-09 — End: 2021-07-27

## 2020-07-09 MED ORDER — OLANZAPINE 7.5 MG PO TABS
7.5000 mg | ORAL_TABLET | Freq: Every day | ORAL | 0 refills | Status: DC
Start: 2020-07-09 — End: 2021-07-27

## 2020-07-09 NOTE — ED Notes (Signed)
Pt denies SI/HI/AVH on assessment 

## 2020-07-09 NOTE — ED Notes (Signed)
Pt brought into ED BHU via sally port and wand with metal detector for safety by  Security officer. Patient oriented to unit/care area: Pt informed of unit policies and procedures.  Informed that, for their safety, care areas are designed for safety and monitored by security cameras at all times. Patient verbalizes understanding, and verbal contract for safety obtained.Pt shown to their room.  

## 2020-07-09 NOTE — ED Notes (Signed)
VOL, pend D/C 

## 2020-07-09 NOTE — ED Provider Notes (Signed)
The patient has been evaluated at bedside by Dr. Lucianne Muss, psychiatry.  Patient is clinically stable.  Not felt to be a danger to self or others.  No SI or Hi.  No indication for inpatient psychiatric admission at this time.  Appropriate for continued outpatient therapy.    Willy Eddy, MD 07/09/20 1318

## 2020-07-09 NOTE — ED Provider Notes (Signed)
Emergency Medicine Observation Re-evaluation Note  Steven Khan is a 39 y.o. male, seen on rounds today.  Pt initially presented to the ED for complaints of Psychiatric Evaluation Currently, the patient is calm, resting.  Physical Exam  BP 128/90 (BP Location: Right Arm)   Pulse 84   Temp 98.6 F (37 C) (Oral)   Resp 16   Ht 6\' 1"  (1.854 m)   Wt 95.3 kg   SpO2 99%   BMI 27.71 kg/m  Physical Exam General: non-toxic, well appearing Cardiac: well perfused Lungs: even, unlabored resp Psych: calm and cooperative   ED Course / MDM  EKG:    I have reviewed the labs performed to date as well as medications administered while in observation.  Recent changes in the last 24 hours include none.  Plan  Current plan is for psych eval. Patient is under full IVC at this time.   , MD 07/09/20 0730

## 2020-07-09 NOTE — Discharge Instructions (Signed)
[facility] [place] Address: [address] [place], [place], [place] [address] Hours:  Open ? Closes 8PM Phone: [phone]
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
## 2020-07-24 ENCOUNTER — Emergency Department: Payer: HRSA Program

## 2020-07-24 ENCOUNTER — Other Ambulatory Visit: Payer: Self-pay

## 2020-07-24 ENCOUNTER — Emergency Department
Admission: EM | Admit: 2020-07-24 | Discharge: 2020-07-24 | Disposition: A | Discharge status: Home / Self Care | Payer: HRSA Program | Attending: Emergency Medicine | Admitting: Emergency Medicine

## 2020-07-24 ENCOUNTER — Telehealth (HOSPITAL_COMMUNITY): Payer: Self-pay | Admitting: Adult Health

## 2020-07-24 ENCOUNTER — Encounter: Payer: Self-pay | Admitting: Emergency Medicine

## 2020-07-24 DIAGNOSIS — R0981 Nasal congestion: Secondary | ICD-10-CM | POA: Diagnosis present

## 2020-07-24 DIAGNOSIS — F17200 Nicotine dependence, unspecified, uncomplicated: Secondary | ICD-10-CM | POA: Insufficient documentation

## 2020-07-24 DIAGNOSIS — U071 COVID-19: Secondary | ICD-10-CM | POA: Insufficient documentation

## 2020-07-24 LAB — RESPIRATORY PANEL BY RT PCR (FLU A&B, COVID)
Influenza A by PCR: NEGATIVE
Influenza B by PCR: NEGATIVE
SARS Coronavirus 2 by RT PCR: POSITIVE — AB

## 2020-07-24 MED ORDER — PREDNISONE 10 MG (21) PO TBPK: ORAL_TABLET | ORAL | 0 refills | Status: DC

## 2020-07-24 NOTE — ED Provider Notes (Signed)
[facility] Emergency Department Provider Note  ____________________________________________   First MD Initiated Contact with Patient 07/24/20 1345     (approximate)  I have reviewed the triage vital signs and the nursing notes.   HISTORY  Chief Complaint Covid Test    HPI Steven Khan is a 39 y.o. male presents emergency department nasal congestion body aches for 2 days.  Multiple people in the household have same symptoms.  One person just tested positive for Covid.  He denies chest pain or shortness of breath but states he does have some difficulty breathing.  States he has body aches all over.    History reviewed. No pertinent past medical history.  Patient Active Problem List   Diagnosis Date Noted  . 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 Suicidal ideations 06/02/2020  . PTSD (post-traumatic stress disorder) 06/01/2020  . Alcohol abuse 06/01/2020  . Cocaine abuse (HCC) 06/01/2020  . Abdominal pain 06/01/2020  . Involuntary commitment 06/01/2020  . Vomiting 06/01/2020  . Intractable abdominal pain 06/01/2020    Past Surgical History:  Procedure Laterality Date  . HERNIA REPAIR      Prior to Admission medications   Medication Sig Start Date End Date Taking? Authorizing Provider  famotidine (PEPCID) 20 MG tablet Take 1 tablet (20 mg total) by mouth 2 (two) times daily. 07/09/20   Charm Rings, NP  OLANZapine (ZYPREXA) 7.5 MG tablet Take 1 tablet (7.5 mg total) by mouth at bedtime. 07/09/20   Charm Rings, NP  predniSONE (STERAPRED UNI-PAK 21 TAB) 10 MG (21) TBPK tablet Take 6 pills on day one then decrease by 1 pill each day 07/24/20   Faythe Ghee, PA-C    Allergies Patient has no known allergies.  History reviewed. No pertinent family history.  Social History Social History   Tobacco Use  . Smoking status: Current Every Day Smoker  . Smokeless tobacco: Never Used  Substance Use Topics  . Alcohol use: Yes    . Drug use: Yes    Types: Cocaine, Marijuana    Comment: ecstasy    Review of Systems  Constitutional: Positive fever/chills Eyes: No visual changes. ENT: No sore throat. Respiratory: Positive cough Cardiovascular: Denies chest pain Gastrointestinal: Denies abdominal pain Genitourinary: Negative for dysuria. Musculoskeletal: Negative for back pain. Skin: Negative for rash. Psychiatric: no mood changes,     ____________________________________________   PHYSICAL EXAM:  VITAL SIGNS: ED Triage Vitals  Enc Vitals Group     BP 07/24/20 1235 125/78     Pulse Rate 07/24/20 1235 83     Resp 07/24/20 1235 20     Temp 07/24/20 1235 98.7 F (37.1 C)     Temp Source 07/24/20 1235 Oral     SpO2 07/24/20 1235 100 %     Weight 07/24/20 1236 210 lb (95.3 kg)     Height 07/24/20 1236 6\' 1"  (1.854 m)     Head Circumference --      Peak Flow --      Pain Score 07/24/20 1236 7     Pain Loc --      Pain Edu? --      Excl. in GC? --     Constitutional: Alert and oriented. Well appearing and in no acute distress. Eyes: Conjunctivae are normal.  Head: Atraumatic. Nose: No congestion/rhinnorhea. Mouth/Throat: Mucous membranes are moist.   Neck:  supple no lymphadenopathy noted Cardiovascular: Normal rate, regular rhythm. Heart sounds are normal Respiratory: Normal respiratory effort.  No retractions, lungs c t a  GU: deferred Musculoskeletal: FROM all extremities, warm and well perfused Neurologic:  Normal speech and language.  Skin:  Skin is warm, dry and intact. No rash noted. Psychiatric: Mood and affect are normal. Speech and behavior are normal.  ____________________________________________   LABS (all labs ordered are listed, but only abnormal results are displayed)  Labs Reviewed  RESPIRATORY PANEL BY RT PCR (FLU A&B, COVID) - Abnormal; Notable for the following components:      Result Value   SARS Coronavirus 2 by RT PCR POSITIVE (*)    All other components within  normal limits   ____________________________________________   ____________________________________________  RADIOLOGY  Chest x-ray is negative for any acute abnormality  ____________________________________________   PROCEDURES  Procedure(s) performed: No  Procedures    ____________________________________________   INITIAL IMPRESSION / ASSESSMENT AND PLAN / ED COURSE  Pertinent labs & imaging results that were available during my care of the patient were reviewed by me and considered in my medical decision making (see chart for details).   Patient is 39 year old male presents emergency department Covid-like symptoms.  See HPI.  Physical exam shows patient to appear stable.  Chest x-ray and Covid test ordered  Chest x-ray is negative, Covid test positive  I did explain the findings to the patient.  Secure message sent to the infusion clinic.  They are going to screen him for qualification.  Will call with an appointment.  He was to take Sterapred, zinc, vitamin C.  Return emergency department worsening.  Discharged in stable condition.  He was also instructed to quarantine for 14 days from onset of symptoms.     Steven Khan was evaluated in Emergency Department on 07/24/2020 for the symptoms described in the history of present illness. He was evaluated in the context of the global COVID-19 pandemic, which necessitated consideration that the patient might be at risk for infection with the SARS-CoV-2 virus that causes COVID-19. Institutional protocols and algorithms that pertain to the evaluation of patients at risk for COVID-19 are in a state of rapid change based on information released by regulatory bodies including the CDC and federal and state organizations. These policies and algorithms were followed during the patient's care in the ED.    As part of my medical decision making, I reviewed the following data within the electronic MEDICAL RECORD NUMBER Nursing notes  reviewed and incorporated, Labs reviewed , Old chart reviewed, Radiograph reviewed , Notes from prior ED visits and [place] Controlled Substance Database  ____________________________________________   FINAL CLINICAL IMPRESSION(S) / ED DIAGNOSES  Final diagnoses:  COVID-19      NEW MEDICATIONS STARTED DURING THIS VISIT:  Discharge Medication List as of 07/24/2020  3:34 PM    START taking these medications   Details  predniSONE (STERAPRED UNI-PAK 21 TAB) 10 MG (21) TBPK tablet Take 6 pills on day one then decrease by 1 pill each day, Normal         Note:  This document was prepared using Dragon voice recognition software and may include unintentional dictation errors.    Faythe Ghee, PA-C 07/24/20 1659    Minna Antis, MD 07/26/20 2235

## 2020-07-24 NOTE — Discharge Instructions (Signed)
Follow-up with regular doctor if not improved in 2 to 3 days.  Return emergency department worsening.  Take medication as prescribed.  You could also take over-the-counter Mucinex, zinc, and vitamin C. If you are a candidate the infusion clinic will call you for an appointment.  If not heard from them in 2 days please call the number provided.

## 2020-07-24 NOTE — ED Notes (Signed)
Provider notified that the patient came back positive for COVID.

## 2020-07-24 NOTE — Telephone Encounter (Signed)
Called  regarding monoclonal antibody treatment for COVID 19 given to those who are at risk for complications and/or hospitalization of the virus.  Patient meets criteria based on: substance abuse, social vulnerability, BMI greater than 25  Patient does not have a working phone number.    Lillard Anes, NP

## 2020-07-24 NOTE — ED Notes (Signed)
Radiology at bedside

## 2020-07-24 NOTE — ED Triage Notes (Signed)
Pt presents to ED via POV with nasal congestion and body aches x 2 days. Pt states multiple people in the house hold being tested due to covid symptoms. VSS in triage at this time.

## 2020-07-25 ENCOUNTER — Other Ambulatory Visit (HOSPITAL_COMMUNITY): Payer: Self-pay | Admitting: Nurse Practitioner

## 2020-07-25 ENCOUNTER — Telehealth (HOSPITAL_COMMUNITY): Payer: Self-pay | Admitting: Nurse Practitioner

## 2020-07-25 DIAGNOSIS — U071 COVID-19: Secondary | ICD-10-CM

## 2020-07-25 NOTE — Progress Notes (Signed)
I connected by phone with patient to discuss the potential use of a new treatment for mild to moderate COVID-19 viral infection in non-hospitalized patients.   This patient is a that meets the FDA criteria for Emergency Use Authorization of COVID monoclonal antibody casirivimab/imdevimab. 1. Has a (+) direct SARS-CoV-2 viral test result 2. Has mild or moderate COVID-19  3. Is NOT hospitalized due to COVID-19 4. Is within 10 days of symptom onset 5. Has at least one of the high risk factor(s) for progression to severe COVID-19 and/or hospitalization as defined in EUA. ? Specific high risk criteria : Diabetes, hypertension, obesity     I have spoken and communicated the following to the patient or parent/caregiver regarding COVID monoclonal antibody treatment:   1. FDA has authorized the emergency use for the treatment of high risk post-exposure prophylaxis for COVID19.    2. The significant known and potential risks and benefits of COVID monoclonal antibody, and the extent to which such potential risks and benefits are unknown.   3. Information on available alternative treatments and the risks and benefits of those alternatives, including clinical trials.   4. Patients treated with COVID monoclonal antibody should continue to self-isolate and use infection control measures (e.g., wear mask, isolate, social distance, avoid sharing personal items, clean and disinfect "high touch" surfaces, and frequent handwashing) according to CDC guidelines.    5. The patient or parent/caregiver has the option to accept or refuse COVID monoclonal antibody treatment.   After reviewing this information with the patient, The patient agreed to proceed with receiving casirivimab\imdevimab infusion and will be provided a copy of the Fact sheet prior to receiving the infusion. Ocie Bob, NP

## 2020-07-26 NOTE — Telephone Encounter (Signed)
Spoke with patient while he was at his friend's house regarding his Covid diagnosis and possible MAB treatment.   Scheduled him for MAB infusion.

## 2020-07-27 ENCOUNTER — Emergency Department
Admission: EM | Admit: 2020-07-27 | Discharge: 2020-07-27 | Disposition: A | Discharge status: Home / Self Care | Payer: HRSA Program | Attending: Emergency Medicine | Admitting: Emergency Medicine

## 2020-07-27 ENCOUNTER — Ambulatory Visit (HOSPITAL_COMMUNITY): Payer: Self-pay

## 2020-07-27 ENCOUNTER — Other Ambulatory Visit: Payer: Self-pay

## 2020-07-27 DIAGNOSIS — R109 Unspecified abdominal pain: Secondary | ICD-10-CM | POA: Insufficient documentation

## 2020-07-27 DIAGNOSIS — U071 COVID-19: Secondary | ICD-10-CM | POA: Insufficient documentation

## 2020-07-27 DIAGNOSIS — F1590 Other stimulant use, unspecified, uncomplicated: Secondary | ICD-10-CM | POA: Insufficient documentation

## 2020-07-27 DIAGNOSIS — R5383 Other fatigue: Secondary | ICD-10-CM | POA: Diagnosis present

## 2020-07-27 DIAGNOSIS — F17200 Nicotine dependence, unspecified, uncomplicated: Secondary | ICD-10-CM | POA: Diagnosis not present

## 2020-07-27 DIAGNOSIS — R112 Nausea with vomiting, unspecified: Secondary | ICD-10-CM | POA: Diagnosis not present

## 2020-07-27 DIAGNOSIS — R1115 Cyclical vomiting syndrome unrelated to migraine: Secondary | ICD-10-CM

## 2020-07-27 LAB — URINALYSIS, COMPLETE (UACMP) WITH MICROSCOPIC
Bacteria, UA: NONE SEEN
Bilirubin Urine: NEGATIVE
Glucose, UA: NEGATIVE mg/dL
Hgb urine dipstick: NEGATIVE
Ketones, ur: NEGATIVE mg/dL
Leukocytes,Ua: NEGATIVE
Nitrite: NEGATIVE
Protein, ur: 30 mg/dL — AB
Specific Gravity, Urine: 1.023 (ref 1.005–1.030)
pH: 5 (ref 5.0–8.0)

## 2020-07-27 LAB — CBC
HCT: 40.2 % (ref 39.0–52.0)
Hemoglobin: 13.9 g/dL (ref 13.0–17.0)
MCH: 32.7 pg (ref 26.0–34.0)
MCHC: 34.6 g/dL (ref 30.0–36.0)
MCV: 94.6 fL (ref 80.0–100.0)
Platelets: 286 10*3/uL (ref 150–400)
RBC: 4.25 MIL/uL (ref 4.22–5.81)
RDW: 14 % (ref 11.5–15.5)
WBC: 9.9 10*3/uL (ref 4.0–10.5)
nRBC: 0 % (ref 0.0–0.2)

## 2020-07-27 LAB — COMPREHENSIVE METABOLIC PANEL
ALT: 15 U/L (ref 0–44)
AST: 21 U/L (ref 15–41)
Albumin: 4.5 g/dL (ref 3.5–5.0)
Alkaline Phosphatase: 44 U/L (ref 38–126)
Anion gap: 8 (ref 5–15)
BUN: 12 mg/dL (ref 6–20)
CO2: 29 mmol/L (ref 22–32)
Calcium: 9.2 mg/dL (ref 8.9–10.3)
Chloride: 98 mmol/L (ref 98–111)
Creatinine, Ser: 1.03 mg/dL (ref 0.61–1.24)
GFR, Estimated: >60 mL/min (ref >60)
Glucose, Bld: 92 mg/dL (ref 70–99)
Potassium: 3.2 mmol/L — ABNORMAL LOW (ref 3.5–5.1)
Sodium: 135 mmol/L (ref 135–145)
Total Bilirubin: 1.2 mg/dL (ref 0.3–1.2)
Total Protein: 7.1 g/dL (ref 6.5–8.1)

## 2020-07-27 LAB — LIPASE, BLOOD: Lipase: 32 U/L (ref 11–51)

## 2020-07-27 MED ORDER — SODIUM CHLORIDE 0.9 % IV SOLN
1000.0000 mL | Freq: Once | INTRAVENOUS | Status: AC
  Administered 2020-07-27: 1000 mL via INTRAVENOUS

## 2020-07-27 MED ORDER — MORPHINE SULFATE (PF) 4 MG/ML IV SOLN
4.0000 mg | Freq: Once | INTRAVENOUS | Status: AC
  Administered 2020-07-27: 4 mg via INTRAVENOUS
  Filled 2020-07-27: qty 1

## 2020-07-27 MED ORDER — ONDANSETRON HCL 4 MG/2ML IJ SOLN: 4.0000 mg | Freq: Once | INTRAMUSCULAR | Status: DC

## 2020-07-27 MED ORDER — DROPERIDOL 2.5 MG/ML IJ SOLN
2.5000 mg | Freq: Once | INTRAMUSCULAR | Status: AC
  Administered 2020-07-27: 2.5 mg via INTRAVENOUS
  Filled 2020-07-27: qty 2

## 2020-07-27 MED ORDER — ONDANSETRON 4 MG PO TBDP: 4.0000 mg | ORAL_TABLET | Freq: Three times a day (TID) | ORAL | 0 refills | Status: DC | PRN

## 2020-07-27 NOTE — ED Triage Notes (Signed)
Pt presents with ABD pain via EMS that started for 24 hours. Pt also has c/o of N/V. Pt denies ETOH. Pt is A&Ox4.

## 2020-07-27 NOTE — ED Notes (Signed)
Pt refuses to keep cardiac monitor or pulse ox on at this time.

## 2020-07-27 NOTE — ED Provider Notes (Signed)
[facility] Emergency Department Provider Note   ____________________________________________    I have reviewed the triage vital signs and the nursing notes.   HISTORY  Chief Complaint Abdominal Pain     HPI Steven Khan is a 39 y.o. male with a history of substance abuse, intractable abdominal pain, recently diagnosed with COVID-19 presents today with nausea and vomiting and abdominal pain.  This is similar to his prior episodes of abdominal pain.  No apparent etiology has been discovered after multiple work-ups.  No shortness of breath reported, no fevers.  Has had fatigue from COVID-19.  History of substance abuse as detailed below  History reviewed. No pertinent past medical history.  Patient Active Problem List   Diagnosis Date Noted  . 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 Suicidal ideations 06/02/2020  . PTSD (post-traumatic stress disorder) 06/01/2020  . Alcohol abuse 06/01/2020  . Cocaine abuse (HCC) 06/01/2020  . Abdominal pain 06/01/2020  . Involuntary commitment 06/01/2020  . Vomiting 06/01/2020  . Intractable abdominal pain 06/01/2020    Past Surgical History:  Procedure Laterality Date  . HERNIA REPAIR      Prior to Admission medications   Medication Sig Start Date End Date Taking? Authorizing Provider  famotidine (PEPCID) 20 MG tablet Take 1 tablet (20 mg total) by mouth 2 (two) times daily. 07/09/20   Charm Rings, NP  OLANZapine (ZYPREXA) 7.5 MG tablet Take 1 tablet (7.5 mg total) by mouth at bedtime. 07/09/20   Charm Rings, NP  ondansetron (ZOFRAN ODT) 4 MG disintegrating tablet Take 1 tablet (4 mg total) by mouth every 8 (eight) hours as needed. 07/27/20   Jene Every, MD  predniSONE (STERAPRED UNI-PAK 21 TAB) 10 MG (21) TBPK tablet Take 6 pills on day one then decrease by 1 pill each day 07/24/20   Faythe Ghee, PA-C     Allergies Patient has no known allergies.  History reviewed. No  pertinent family history.  Social History Social History   Tobacco Use  . Smoking status: Current Every Day Smoker  . Smokeless tobacco: Never Used  Substance Use Topics  . Alcohol use: Yes  . Drug use: Yes    Types: Cocaine, Marijuana    Comment: ecstasy    Review of Systems  Constitutional: No fever/chills Eyes: No visual changes.  ENT: No sore throat. Cardiovascular: Denies chest pain. Respiratory: Denies shortness of breath. Gastrointestinal: As above Genitourinary: Negative for dysuria. Musculoskeletal: Negative for back pain. Skin: Negative for rash. Neurological: Negative for headaches    ____________________________________________   PHYSICAL EXAM:  VITAL SIGNS: ED Triage Vitals  Enc Vitals Group     BP 07/27/20 0831 (!) 131/91     Pulse Rate 07/27/20 0831 (!) 101     Resp --      Temp 07/27/20 0831 98.9 F (37.2 C)     Temp Source 07/27/20 0831 Oral     SpO2 07/27/20 0831 100 %     Weight --      Height --      Head Circumference --      Peak Flow --      Pain Score 07/27/20 0832 10     Pain Loc --      Pain Edu? --      Excl. in GC? --     Constitutional: Alert and oriented.    Nose: No congestion/rhinnorhea. Mouth/Throat: Mucous membranes are moist.   Neck:  Painless ROM Cardiovascular: Normal rate,  regular rhythm. Grossly normal heart sounds.  Good peripheral circulation. Respiratory: Normal respiratory effort.  No retractions. Lungs CTAB. Gastrointestinal: nontender. No distention.  No CVA tenderness.  Musculoskeletal: No lower extremity tenderness nor edema.  Warm and well perfused Neurologic:  Normal speech and language. No gross focal neurologic deficits are appreciated.  Skin:  Skin is warm, dry and intact. No rash noted. Psychiatric: Mood and affect are normal. Speech and behavior are normal.  ____________________________________________   LABS (all labs ordered are listed, but only abnormal results are displayed)  Labs  Reviewed  COMPREHENSIVE METABOLIC PANEL - Abnormal; Notable for the following components:      Result Value   Potassium 3.2 (*)    All other components within normal limits  URINALYSIS, COMPLETE (UACMP) WITH MICROSCOPIC - Abnormal; Notable for the following components:   Color, Urine AMBER (*)    APPearance HAZY (*)    Protein, ur 30 (*)    All other components within normal limits  LIPASE, BLOOD  CBC   ____________________________________________  EKG  None ____________________________________________  RADIOLOGY  None ____________________________________________   PROCEDURES  Procedure(s) performed: No  Procedures   Critical Care performed: No ____________________________________________   INITIAL IMPRESSION / ASSESSMENT AND PLAN / ED COURSE  Pertinent labs & imaging results that were available during my care of the patient were reviewed by me and considered in my medical decision making (see chart for details).  Patient recently diagnosed with COVID-19.  Here with abdominal cramping, nausea and vomiting similar to prior episodes.  I suspect his symptoms today are related to his cyclical vomiting syndrome, possibly COVID-19 related.  Abdominal exam is overall reassuring.  In review of records patient's last urinary drug screen did have cannabis, strong possibility for cannabis hyperemesis syndrome.  QTC is normal, will treat with droperidol 2.5 mg IV for nausea vomiting and pain.  We will give IV fluids as well  ----------------------------------------- 1:18 PM on 07/27/2020 -----------------------------------------  Patient improved with treatment, lab work today is reassuring, patient has outpatient monoclonal antibody infusion scheduled he is ready to go, he knows to return anytime    ____________________________________________   FINAL CLINICAL IMPRESSION(S) / ED DIAGNOSES  Final diagnoses:  Cyclical vomiting  COVID-19        Note:  This  document was prepared using Dragon voice recognition software and may include unintentional dictation errors.   Jene Every, MD 07/27/20 1319

## 2020-09-19 ENCOUNTER — Telehealth: Payer: Self-pay | Admitting: Pharmacy Technician

## 2020-09-19 NOTE — Telephone Encounter (Signed)
Patient failed to provide 2021 proof of income.  No additional medication assistance will be provided by [facility] without the required proof of income documentation.  Patient notified by letter.  Sherilyn Dacosta Care Manager [facility]   [address] [place], [place]  [address]   September 19, 2020    Mr. Osric Klopf [address] [place], [place]  [address]  Dear Mr. Tabar:  This is to inform you that you are no longer eligible to receive medication assistance at [facility].  The reason(s) are:    _____Your total gross monthly household income exceeds 250% of the Federal Poverty Level.   _____Tangible assets (savings, checking, stocks/bonds, pension, retirement, etc.) exceeds our limit  _____You are eligible to receive benefits from [facility], [facility] or HIV Medication              Assistance Program _____You are eligible to receive benefits from a Medicare Part "D" plan _____You have prescription insurance  _____You are not an [place] resident __X__Failure to provide all requested proof of income information for 2021.    Medication assistance will resume once all requested financial information has been returned to our clinic.  If you have questions, please contact our clinic at [phone].    Thank you,  [facility]

## 2021-07-26 ENCOUNTER — Emergency Department
Admission: EM | Admit: 2021-07-26 | Discharge: 2021-07-27 | Disposition: A | Discharge status: Other Acute Inpatient Hospital | Payer: 59 | Attending: Emergency Medicine | Admitting: Emergency Medicine

## 2021-07-26 ENCOUNTER — Other Ambulatory Visit: Payer: Self-pay

## 2021-07-26 DIAGNOSIS — F1410 Cocaine abuse, uncomplicated: Secondary | ICD-10-CM

## 2021-07-26 DIAGNOSIS — F1494 Cocaine use, unspecified with cocaine-induced mood disorder: Secondary | ICD-10-CM | POA: Diagnosis not present

## 2021-07-26 DIAGNOSIS — R110 Nausea: Secondary | ICD-10-CM | POA: Insufficient documentation

## 2021-07-26 DIAGNOSIS — R45851 Suicidal ideations: Secondary | ICD-10-CM | POA: Insufficient documentation

## 2021-07-26 DIAGNOSIS — R1084 Generalized abdominal pain: Secondary | ICD-10-CM | POA: Insufficient documentation

## 2021-07-26 DIAGNOSIS — Z8616 Personal history of COVID-19: Secondary | ICD-10-CM | POA: Insufficient documentation

## 2021-07-26 DIAGNOSIS — F32A Depression, unspecified: Secondary | ICD-10-CM | POA: Insufficient documentation

## 2021-07-26 DIAGNOSIS — F17200 Nicotine dependence, unspecified, uncomplicated: Secondary | ICD-10-CM | POA: Insufficient documentation

## 2021-07-26 DIAGNOSIS — F332 Major depressive disorder, recurrent severe without psychotic features: Secondary | ICD-10-CM | POA: Diagnosis present

## 2021-07-26 DIAGNOSIS — Z20822 Contact with and (suspected) exposure to covid-19: Secondary | ICD-10-CM | POA: Insufficient documentation

## 2021-07-26 LAB — LIPASE, BLOOD: Lipase: 31 U/L (ref 11–51)

## 2021-07-26 LAB — COMPREHENSIVE METABOLIC PANEL
ALT: 88 U/L — ABNORMAL HIGH (ref 0–44)
AST: 186 U/L — ABNORMAL HIGH (ref 15–41)
Albumin: 4.6 g/dL (ref 3.5–5.0)
Alkaline Phosphatase: 60 U/L (ref 38–126)
Anion gap: 12 (ref 5–15)
BUN: 26 mg/dL — ABNORMAL HIGH (ref 6–20)
CO2: 22 mmol/L (ref 22–32)
Calcium: 9.1 mg/dL (ref 8.9–10.3)
Chloride: 103 mmol/L (ref 98–111)
Creatinine, Ser: 1.48 mg/dL — ABNORMAL HIGH (ref 0.61–1.24)
GFR, Estimated: >60 mL/min (ref >60)
Glucose, Bld: 113 mg/dL — ABNORMAL HIGH (ref 70–99)
Potassium: 4.8 mmol/L (ref 3.5–5.1)
Sodium: 137 mmol/L (ref 135–145)
Total Bilirubin: 1.4 mg/dL — ABNORMAL HIGH (ref 0.3–1.2)
Total Protein: 7.4 g/dL (ref 6.5–8.1)

## 2021-07-26 LAB — CBC
HCT: 42.7 % (ref 39.0–52.0)
Hemoglobin: 14.5 g/dL (ref 13.0–17.0)
MCH: 32.1 pg (ref 26.0–34.0)
MCHC: 34 g/dL (ref 30.0–36.0)
MCV: 94.5 fL (ref 80.0–100.0)
Platelets: 286 10*3/uL (ref 150–400)
RBC: 4.52 MIL/uL (ref 4.22–5.81)
RDW: 14.5 % (ref 11.5–15.5)
WBC: 12.5 10*3/uL — ABNORMAL HIGH (ref 4.0–10.5)
nRBC: 0 % (ref 0.0–0.2)

## 2021-07-26 LAB — URINE DRUG SCREEN, QUALITATIVE (ARMC ONLY)
Amphetamines, Ur Screen: NOT DETECTED
Barbiturates, Ur Screen: NOT DETECTED
Benzodiazepine, Ur Scrn: NOT DETECTED
Cannabinoid 50 Ng, Ur ~~LOC~~: POSITIVE — AB
Cocaine Metabolite,Ur ~~LOC~~: POSITIVE — AB
MDMA (Ecstasy)Ur Screen: NOT DETECTED
Methadone Scn, Ur: NOT DETECTED
Opiate, Ur Screen: NOT DETECTED
Phencyclidine (PCP) Ur S: NOT DETECTED
Tricyclic, Ur Screen: NOT DETECTED

## 2021-07-26 LAB — ETHANOL: Alcohol, Ethyl (B): <10 mg/dL (ref <10)

## 2021-07-26 LAB — ACETAMINOPHEN LEVEL: Acetaminophen (Tylenol), Serum: <10 ug/mL — ABNORMAL LOW (ref 10–30)

## 2021-07-26 LAB — TROPONIN I (HIGH SENSITIVITY): Troponin I (High Sensitivity): 9 ng/L (ref <18)

## 2021-07-26 LAB — SALICYLATE LEVEL: Salicylate Lvl: <7 mg/dL — ABNORMAL LOW (ref 7.0–30.0)

## 2021-07-26 LAB — RESP PANEL BY RT-PCR (FLU A&B, COVID) ARPGX2
Influenza A by PCR: NEGATIVE
Influenza B by PCR: NEGATIVE
SARS Coronavirus 2 by RT PCR: NEGATIVE

## 2021-07-26 MED ORDER — ONDANSETRON 4 MG PO TBDP
4.0000 mg | ORAL_TABLET | Freq: Once | ORAL | Status: AC
  Administered 2021-07-26: 4 mg via ORAL
  Filled 2021-07-26: qty 1
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
## 2021-07-26 NOTE — ED Triage Notes (Signed)
Patient BIB EMS after attempting to smoke crack cocaine but thinks it was something besides crack. Patient complains of abdominal pain and throat pain, states vomited once. Patient also states he just doesn't want to live anymore, states he needs to get somewhere and "get off drugs". Patient denies plan to end life at this time. Patient is AxOx4.

## 2021-07-26 NOTE — BH Assessment (Signed)
Referral information for Psychiatric Hospitalization faxed to;   [facility] [phone])  [facility] [phone])  [place] [phone] or [phone])

## 2021-07-26 NOTE — ED Notes (Signed)
Patient declined shower.

## 2021-07-26 NOTE — ED Provider Notes (Signed)
The patient has been placed in psychiatric observation due to the need to provide a safe environment for the patient while obtaining psychiatric consultation and evaluation, as well as ongoing medical and medication management to treat the patient's condition.  The patient has not been placed under full IVC at this time.    Willy Eddy, MD 07/26/21 [phone]

## 2021-07-26 NOTE — ED Notes (Signed)
Pt lying in bed with eyes closed; easily aroused when name called. Pt calm, cooperative. Pt states that he feels "bad" and "I'm just tired". Pt c/o stomach pain which he says is "calming down" and rates pain 3/10 on 0-10 pain scale. Pt endorses SI without plan. Pt unsure whether he is experiencing anxiety but states that he is depressed, stating "it's the drugs" and "people were trying to kill me tonight." No acute distress noted.

## 2021-07-26 NOTE — Consult Note (Signed)
[facility] Face-to-Face Psychiatry Consult   Reason for Consult:  cocaine use and depression Referring Physician:  EDP Patient Identification: Steven Khan MRN:  [id number] Principal Diagnosis: Cocaine induced mood disorder Diagnosis:  Cocaine induced mood disorder  Total Time spent with patient: 1 hour  Subjective:   Steven Khan is a 40 y.o. male patient admitted with cocaine abuse and depression.  "I want substance abuse treatment".  HPI:  40 yo male presented to the ED after using crack/cocaine with depression, sore throat from smoking.  Passive, intermittent suicidal ideations at times, no plan or intent, no past suicide attempts.  Denies anxiety and psychosis and paranoia.  He specifically wants help with cocaine abuse, he uses "a lot" daily.  He requests to go to [facility].  No mania, homicidal ideations, or withdrawal symptoms.  Psychiatrically stable for detox/rehab.  Past Psychiatric History: depression, substance abuse  Risk to Self:  none Risk to Others:  none Prior Inpatient Therapy:  a couple Prior Outpatient Therapy:  none  Past Medical History: No past medical history on file.  Past Surgical History:  Procedure Laterality Date   HERNIA REPAIR     Family History: No family history on file. Family Psychiatric  History: none Social History:  Social History   Substance and Sexual Activity  Alcohol Use Yes     Social History   Substance and Sexual Activity  Drug Use Yes   Types: Cocaine, Marijuana   Comment: ecstasy    Social History   Socioeconomic History   Marital status: Divorced    Spouse name: Not on file   Number of children: Not on file   Years of education: Not on file   Highest education level: Not on file  Occupational History   Not on file  Tobacco Use   Smoking status: Every Day   Smokeless tobacco: Never  Substance and Sexual Activity   Alcohol use: Yes   Drug use: Yes    Types: Cocaine, Marijuana    Comment: ecstasy   Sexual  activity: Not on file  Other Topics Concern   Not on file  Social History Narrative   Not on file   Social Determinants of Health   Financial Resource Strain: Not on file  Food Insecurity: Not on file  Transportation Needs: Not on file  Physical Activity: Not on file  Stress: Not on file  Social Connections: Not on file   Additional Social History:    Allergies:  Not on File  Labs:  Results for orders placed or performed during the hospital encounter of 07/26/21 (from the past 48 hour(s))  Comprehensive metabolic panel     Status: Abnormal   Collection Time: 07/26/21  5:45 AM  Result Value Ref Range   Sodium 137 135 - 145 mmol/L   Potassium 4.8 3.5 - 5.1 mmol/L   Chloride 103 98 - 111 mmol/L   CO2 22 22 - 32 mmol/L   Glucose, Bld 113 (H) 70 - 99 mg/dL    Comment: Glucose reference range applies only to samples taken after fasting for at least 8 hours.   BUN 26 (H) 6 - 20 mg/dL   Creatinine, Ser 8.84 (H) 0.61 - 1.24 mg/dL   Calcium 9.1 8.9 - 16.6 mg/dL   Total Protein 7.4 6.5 - 8.1 g/dL   Albumin 4.6 3.5 - 5.0 g/dL   AST 063 (H) 15 - 41 U/L   ALT 88 (H) 0 - 44 U/L   Alkaline Phosphatase 60  38 - 126 U/L   Total Bilirubin 1.4 (H) 0.3 - 1.2 mg/dL   GFR, Estimated >20 >94 mL/min    Comment: (NOTE) Calculated using the CKD-EPI Creatinine Equation (2021)    Anion gap 12 5 - 15    Comment: Performed at [facility], [address] [place], [place] [address]  Ethanol     Status: None   Collection Time: 07/26/21  5:45 AM  Result Value Ref Range   Alcohol, Ethyl (B) <10 <10 mg/dL    Comment: (NOTE) Lowest detectable limit for serum alcohol is 10 mg/dL.  For medical purposes only. Performed at [facility], [address] [place], [place] [address]   Salicylate level     Status: Abnormal   Collection Time: 07/26/21  5:45 AM  Result Value Ref Range   Salicylate Lvl <7.0 (L) 7.0 - 30.0 mg/dL    Comment: Performed at [facility], [address] [place], [place] [address]  Acetaminophen level     Status: Abnormal   Collection Time: 07/26/21  5:45 AM  Result Value Ref Range   Acetaminophen (Tylenol), Serum <10 (L) 10 - 30 ug/mL    Comment: (NOTE) Therapeutic concentrations vary significantly. A range of 10-30 ug/mL  may be an effective concentration for many patients. However, some  are best treated at concentrations outside of this range. Acetaminophen concentrations >150 ug/mL at 4 hours after ingestion  and >50 ug/mL at 12 hours after ingestion are often associated with  toxic reactions.  Performed at [facility], [address] [place], [place] [address]   cbc     Status: Abnormal   Collection Time: 07/26/21  5:45 AM  Result Value Ref Range   WBC 12.5 (H) 4.0 - 10.5 K/uL   RBC 4.52 4.22 - 5.81 MIL/uL   Hemoglobin 14.5 13.0 - 17.0 g/dL   HCT 54.6 56.8 - 12.7 %   MCV 94.5 80.0 - 100.0 fL   MCH 32.1 26.0 - 34.0 pg   MCHC 34.0 30.0 - 36.0 g/dL   RDW 51.7 00.1 - 74.9 %   Platelets 286 150 - 400 K/uL   nRBC 0.0 0.0 - 0.2 %    Comment: Performed at [facility], [address] [place], [place] [address]  Lipase, blood     Status: None   Collection Time: 07/26/21  5:45 AM  Result Value Ref Range   Lipase 31 11 - 51 U/L    Comment: Performed at [facility], [address] [place], [place] [address]  Urine Drug Screen, Qualitative     Status: Abnormal   Collection Time: 07/26/21  5:56 AM  Result Value Ref Range   Tricyclic, Ur Screen NONE DETECTED NONE DETECTED   Amphetamines, Ur Screen NONE DETECTED NONE DETECTED   MDMA (Ecstasy)Ur Screen NONE DETECTED NONE DETECTED   Cocaine Metabolite,Ur [place] POSITIVE (A) NONE DETECTED   Opiate, Ur Screen NONE DETECTED NONE DETECTED   Phencyclidine (PCP) Ur S NONE DETECTED NONE DETECTED   Cannabinoid 50 Ng, Ur [place] POSITIVE (A) NONE DETECTED   Barbiturates, Ur Screen NONE DETECTED NONE DETECTED   Benzodiazepine, Ur Scrn NONE DETECTED NONE  DETECTED   Methadone Scn, Ur NONE DETECTED NONE DETECTED    Comment: (NOTE) Tricyclics + metabolites, urine    Cutoff 1000 ng/mL Amphetamines + metabolites, urine  Cutoff 1000 ng/mL MDMA (Ecstasy), urine              Cutoff 500 ng/mL Cocaine  Metabolite, urine          Cutoff 300 ng/mL Opiate + metabolites, urine        Cutoff 300 ng/mL Phencyclidine (PCP), urine         Cutoff 25 ng/mL Cannabinoid, urine                 Cutoff 50 ng/mL Barbiturates + metabolites, urine  Cutoff 200 ng/mL Benzodiazepine, urine              Cutoff 200 ng/mL Methadone, urine                   Cutoff 300 ng/mL  The urine drug screen provides only a preliminary, unconfirmed analytical test result and should not be used for non-medical purposes. Clinical consideration and professional judgment should be applied to any positive drug screen result due to possible interfering substances. A more specific alternate chemical method must be used in order to obtain a confirmed analytical result. Gas chromatography / mass spectrometry (GC/MS) is the preferred confirm atory method. Performed at [facility], [address] [place], [place] [address]   Troponin I (High Sensitivity)     Status: None   Collection Time: 07/26/21  7:46 AM  Result Value Ref Range   Troponin I (High Sensitivity) 9 <18 ng/L    Comment: (NOTE) Elevated high sensitivity troponin I (hsTnI) values and significant  changes across serial measurements may suggest ACS but many other  chronic and acute conditions are known to elevate hsTnI results.  Refer to the "Links" section for chest pain algorithms and additional  guidance. Performed at [facility], [address] [place], [place] [address]     No current facility-administered medications for this encounter.   Current Outpatient Medications  Medication Sig Dispense Refill   famotidine (PEPCID) 20 MG tablet Take 1 tablet (20 mg total) by mouth 2 (two) times  daily. 60 tablet 0   OLANZapine (ZYPREXA) 7.5 MG tablet Take 1 tablet (7.5 mg total) by mouth at bedtime. 30 tablet 0   ondansetron (ZOFRAN ODT) 4 MG disintegrating tablet Take 1 tablet (4 mg total) by mouth every 8 (eight) hours as needed. 20 tablet 0   predniSONE (STERAPRED UNI-PAK 21 TAB) 10 MG (21) TBPK tablet Take 6 pills on day one then decrease by 1 pill each day 21 tablet 0    Musculoskeletal: Strength & Muscle Tone: within normal limits Gait & Station: normal Patient leans: N/A  Psychiatric Specialty Exam: Physical Exam Vitals and nursing note reviewed.  Constitutional:      Appearance: Normal appearance.  HENT:     Head: Normocephalic.     Nose: Nose normal.  Pulmonary:     Effort: Pulmonary effort is normal.  Musculoskeletal:        General: Normal range of motion.     Cervical back: Normal range of motion.  Neurological:     General: No focal deficit present.     Mental Status: He is alert and oriented to person, place, and time.  Psychiatric:        Attention and Perception: Attention and perception normal.        Mood and Affect: Mood is depressed.        Speech: Speech normal.        Behavior: Behavior normal. Behavior is cooperative.        Thought Content: Thought content normal.        Cognition and Memory: Cognition and memory  normal.        Judgment: Judgment normal.    Review of Systems  HENT:  Positive for sore throat.   Psychiatric/Behavioral:  Positive for depression and substance abuse.   All other systems reviewed and are negative.  Blood pressure 118/75, pulse 95, temperature 97.7 F (36.5 C), temperature source Oral, resp. rate 18, height 6\' 1"  (1.854 m), weight 95.3 kg, SpO2 98 %.Body mass index is 27.72 kg/m.  General Appearance: Casual  Eye Contact:  Good  Speech:  Normal Rate  Volume:  Decreased  Mood:  Depressed  Affect:  Congruent  Thought Process:  Coherent and Descriptions of Associations: Intact  Orientation:  Full (Time, Place,  and Person)  Thought Content:  WDL and Logical  Suicidal Thoughts:  No  Homicidal Thoughts:  No  Memory:  Immediate;   Good Recent;   Good Remote;   Good  Judgement:  Fair  Insight:  Fair  Psychomotor Activity:  Normal  Concentration:  Concentration: Good and Attention Span: Good  Recall:  Good  Fund of Knowledge:  Fair  Language:  Good  Akathisia:  No  Handed:  Right  AIMS (if indicated):     Assets:  Housing Leisure Time Physical Health Resilience Social Support  ADL's:  Intact  Cognition:  WNL  Sleep:        Physical Exam: Physical Exam Vitals and nursing note reviewed.  Constitutional:      Appearance: Normal appearance.  HENT:     Head: Normocephalic.     Nose: Nose normal.  Pulmonary:     Effort: Pulmonary effort is normal.  Musculoskeletal:        General: Normal range of motion.     Cervical back: Normal range of motion.  Neurological:     General: No focal deficit present.     Mental Status: He is alert and oriented to person, place, and time.  Psychiatric:        Attention and Perception: Attention and perception normal.        Mood and Affect: Mood is depressed.        Speech: Speech normal.        Behavior: Behavior normal. Behavior is cooperative.        Thought Content: Thought content normal.        Cognition and Memory: Cognition and memory normal.        Judgment: Judgment normal.   Review of Systems  HENT:  Positive for sore throat.   Psychiatric/Behavioral:  Positive for depression and substance abuse.   All other systems reviewed and are negative. Blood pressure 118/75, pulse 95, temperature 97.7 F (36.5 C), temperature source Oral, resp. rate 18, height 6\' 1"  (1.854 m), weight 95.3 kg, SpO2 98 %. Body mass index is 27.72 kg/m.  Treatment Plan Summary: Cocaine induced mood disorder: -Attend [facility]--admission being sought -Refrain from alcohol and drug use -Started gabapentin 300 mg TID for stimulant withdrawal  Disposition:  No evidence of imminent risk to self or others at present.   Patient does not meet criteria for psychiatric inpatient admission. Supportive therapy provided about ongoing stressors.  , NP 07/26/2021 10:39 AM

## 2021-07-26 NOTE — ED Triage Notes (Signed)
First nurse note: Pt to ED via EMS from [facility] [employer], states smoked unknown drugs and now having hallucinations and paranoid.  Vitals 132/90, 100 HR, 98% RA.

## 2021-07-26 NOTE — ED Notes (Signed)
Pt lying in bed with eyes closes; easily aroused when name called. Pt states that he feels "tired". Pt denies pain and SI/HI/AVH at this time. Initially patient stated "not really" when asked about SI then when asked if he was having SI, pt stated "no". Pt states that he has "good rest in the hallway" last night. Pt described his appetite as "fine". Pt denies anxiety stating "I'm just really tired" but says that he is depressed "a little when I start thinking about things then I'm sad." No acute distress noted.

## 2021-07-26 NOTE — ED Notes (Signed)
Pt was escorted to the BHU in a wheelchair by NT and security. PT was calm and cooperative.

## 2021-07-26 NOTE — ED Notes (Signed)
Assumed care; pt report received from Kina C, RN 

## 2021-07-26 NOTE — ED Notes (Signed)
Hospital meal provided, pt tolerated w/o complaints.  Waste discarded appropriately.  

## 2021-07-26 NOTE — ED Notes (Signed)
Belongings retrieved from patient and placed in belongings bags:  1 set yellow earrings 5 yellow necklaces 1 black shirt 1 pair jeans 1 belt (black) 1 pair black shoes 1 pair black socks 1 pair blue underwear 1 lighter 1 wallet (six 20's, three 10's, 5 1's)

## 2021-07-26 NOTE — ED Provider Notes (Signed)
[facility]  ____________________________________________   Event Date/Time   First MD Initiated Contact with Patient 07/26/21 [phone]     (approximate)  I have reviewed the triage vital signs and the nursing notes.   HISTORY  Chief Complaint Drug Problem and Suicidal    HPI Steven Khan is a 40 y.o. male with past medical history of cocaine induced mood disorder, PTSD, alcohol use, chronic abdominal pain who presents after using cocaine with suicidal ideation.  Patient tells me that he used cocaine which he thinks was laced with something and it made him feel unwell.  He endorses some generalized abdominal pain as well as nausea but no vomiting.  Denies fevers chills denies chest pain or difficulty breathing.  Patient also endorses suicidal ideation.  Tells me that he just does not want to live anymore.  Has thought about overdosing.  Denies any homicidal ideation.  Patient would like to speak to psychiatrist.         No past medical history on file.  Patient Active Problem List   Diagnosis Date Noted   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   Suicidal ideations 06/02/2020   PTSD (post-traumatic stress disorder) 06/01/2020   Alcohol abuse 06/01/2020   Cocaine abuse (HCC) 06/01/2020   Abdominal pain 06/01/2020   Involuntary commitment 06/01/2020   Vomiting 06/01/2020   Intractable abdominal pain 06/01/2020    Past Surgical History:  Procedure Laterality Date   HERNIA REPAIR      Prior to Admission medications   Medication Sig Start Date End Date Taking? Authorizing Provider  famotidine (PEPCID) 20 MG tablet Take 1 tablet (20 mg total) by mouth 2 (two) times daily. 07/09/20   Charm Rings, NP  OLANZapine (ZYPREXA) 7.5 MG tablet Take 1 tablet (7.5 mg total) by mouth at bedtime. 07/09/20   Charm Rings, NP  ondansetron (ZOFRAN ODT) 4 MG disintegrating tablet Take 1 tablet (4 mg total) by mouth every 8 (eight) hours as needed. 07/27/20    Jene Every, MD  predniSONE (STERAPRED UNI-PAK 21 TAB) 10 MG (21) TBPK tablet Take 6 pills on day one then decrease by 1 pill each day 07/24/20   Faythe Ghee, PA-C    Allergies Patient has no allergy information on record.  No family history on file.  Social History Social History   Tobacco Use   Smoking status: Every Day   Smokeless tobacco: Never  Substance Use Topics   Alcohol use: Yes   Drug use: Yes    Types: Cocaine, Marijuana    Comment: ecstasy    Review of Systems   Review of Systems  Constitutional:  Negative for appetite change, chills and fever.  Respiratory:  Negative for shortness of breath.   Cardiovascular:  Negative for chest pain.  Gastrointestinal:  Positive for abdominal pain and nausea. Negative for diarrhea and vomiting.  Psychiatric/Behavioral:  Positive for dysphoric mood and suicidal ideas.   All other systems reviewed and are negative.  Physical Exam Updated Vital Signs BP 118/75 (BP Location: Right Arm)   Pulse 95   Temp 97.7 F (36.5 C) (Oral)   Resp 18   Ht 6\' 1"  (1.854 m)   Wt 95.3 kg   SpO2 98%   BMI 27.72 kg/m   Physical Exam Vitals and nursing note reviewed.  Constitutional:      General: He is not in acute distress.    Appearance: Normal appearance.  HENT:     Head: Normocephalic and atraumatic.  Eyes:     General: No scleral icterus.    Conjunctiva/sclera: Conjunctivae normal.  Pulmonary:     Effort: Pulmonary effort is normal. No respiratory distress.     Breath sounds: Normal breath sounds. No wheezing.  Abdominal:     General: There is no distension.     Palpations: Abdomen is soft.     Tenderness: There is no abdominal tenderness. There is no guarding.  Musculoskeletal:        General: No deformity or signs of injury.     Cervical back: Normal range of motion.  Skin:    Coloration: Skin is not jaundiced or pale.  Neurological:     General: No focal deficit present.     Mental Status: He is alert and  oriented to person, place, and time. Mental status is at baseline.  Psychiatric:     Comments: Flat affect, suicidal ideation Patient has good insight, linear thought process,     LABS (all labs ordered are listed, but only abnormal results are displayed)  Labs Reviewed  COMPREHENSIVE METABOLIC PANEL - Abnormal; Notable for the following components:      Result Value   Glucose, Bld 113 (*)    BUN 26 (*)    Creatinine, Ser 1.48 (*)    AST 186 (*)    ALT 88 (*)    Total Bilirubin 1.4 (*)    All other components within normal limits  SALICYLATE LEVEL - Abnormal; Notable for the following components:   Salicylate Lvl <7.0 (*)    All other components within normal limits  ACETAMINOPHEN LEVEL - Abnormal; Notable for the following components:   Acetaminophen (Tylenol), Serum <10 (*)    All other components within normal limits  CBC - Abnormal; Notable for the following components:   WBC 12.5 (*)    All other components within normal limits  URINE DRUG SCREEN, QUALITATIVE ([facility] ONLY) - Abnormal; Notable for the following components:   Cocaine Metabolite,Ur [place] POSITIVE (*)    Cannabinoid 50 Ng, Ur [place] POSITIVE (*)    All other components within normal limits  ETHANOL  LIPASE, BLOOD   ____________________________________________  EKG  Normal sinus rhythm, incomplete right bundle branch block, left axis deviation, J-point elevation in aVL and V1 ____________________________________________  RADIOLOGY I, Randol Kern, personally viewed and evaluated these images (plain radiographs) as part of my medical decision making, as well as reviewing the written report by the radiologist.  ED MD interpretation:  n/a    ____________________________________________   PROCEDURES  Procedure(s) performed (including Critical Care):  Procedures   ____________________________________________   INITIAL IMPRESSION / ASSESSMENT AND PLAN / ED COURSE     Patient is a 40 year old  male who presents with suicidal ideation after using cocaine.  Has presented similarly in the past.  Vital signs within normal limits.  On my evaluation he does not appear intoxicated, he is alert and oriented and is able to provide clear history.  He does endorse depressive thoughts and suicidal ideation with potential plan to overdose.  He is willing to speak with psychiatry today.  He also complains of some abdominal pain and nausea.  Says this feels better now that he vomited.  Labs are notable for a mildly increased AST greater than ALT and very mildly elevated bilirubin.  Does have a leukocytosis as well.  His abdomen however is benign.  Obtained an EKG given he used cocaine and with epigastric pain which does have some abnormalities, there is some J-point  elevation primarily in aVL, review of previous EKG shows similar morphology although somewhat more pronounced today.  Patient however has no real chest pain and his abdominal pain is similar to prior.  I do not feel this is an anginal equivalent.  Check a troponin which is negative.  We will add on a lipase to ensure he does not have pancreatitis.  Suspect this may be in the setting of alcohol use.  Will consult psychiatry and TTS.  We will continue to monitor.  Patient seen by psychiatry.  No IVC at this time.  TTS working to place in rehab at [facility]. Clinical Course as of 07/26/21 1001  Sat Jul 26, 2021  7846 Lipase: 31 [KM]  0826 Troponin I (High Sensitivity): 9 [KM]    Clinical Course User Index [KM] Georga Hacking, MD     ____________________________________________   FINAL CLINICAL IMPRESSION(S) / ED DIAGNOSES  Final diagnoses:  None     ED Discharge Orders     None        Note:  This document was prepared using Dragon voice recognition software and may include unintentional dictation errors.    Georga Hacking, MD 07/26/21 1001

## 2021-07-27 ENCOUNTER — Encounter: Payer: Self-pay | Admitting: Psychiatry

## 2021-07-27 ENCOUNTER — Other Ambulatory Visit: Payer: Self-pay

## 2021-07-27 ENCOUNTER — Inpatient Hospital Stay
Admission: RE | Admit: 2021-07-27 | Discharge: 2021-08-04 | DRG: 885 | Disposition: A | Discharge status: Home / Self Care | Payer: 59 | Source: Intra-hospital | Attending: Behavioral Health | Admitting: Behavioral Health

## 2021-07-27 DIAGNOSIS — F332 Major depressive disorder, recurrent severe without psychotic features: Secondary | ICD-10-CM | POA: Diagnosis present

## 2021-07-27 DIAGNOSIS — R45851 Suicidal ideations: Secondary | ICD-10-CM

## 2021-07-27 DIAGNOSIS — F4310 Post-traumatic stress disorder, unspecified: Secondary | ICD-10-CM | POA: Diagnosis present

## 2021-07-27 DIAGNOSIS — Z20822 Contact with and (suspected) exposure to covid-19: Secondary | ICD-10-CM | POA: Diagnosis present

## 2021-07-27 DIAGNOSIS — F17210 Nicotine dependence, cigarettes, uncomplicated: Secondary | ICD-10-CM | POA: Diagnosis present

## 2021-07-27 DIAGNOSIS — Z9151 Personal history of suicidal behavior: Secondary | ICD-10-CM | POA: Diagnosis not present

## 2021-07-27 DIAGNOSIS — F1410 Cocaine abuse, uncomplicated: Secondary | ICD-10-CM | POA: Diagnosis present

## 2021-07-27 MED ORDER — NICOTINE 14 MG/24HR TD PT24
14.0000 mg | MEDICATED_PATCH | Freq: Every day | TRANSDERMAL | Status: DC
  Filled 2021-07-27: qty 1

## 2021-07-27 MED ORDER — GABAPENTIN 300 MG PO CAPS: 300.0000 mg | ORAL_CAPSULE | Freq: Three times a day (TID) | ORAL | Status: DC

## 2021-07-27 NOTE — BH Assessment (Signed)
Patient is to be admitted to [facility] by Psychiatric Nurse Practitioner Nanine Means.  Attending Physician will be Dr. Neale Burly.   Patient has been assigned to room 304, by [facility] Charge Nurse Mat.   Intake Paper Work has been signed and placed on patient chart.  ER staff is aware of the admission: Ronnie,ER Secretary   Dr. Marisa Severin, ER MD  Kathlen Mody, Patient's Nurse

## 2021-07-27 NOTE — BH Assessment (Signed)
Writer spoke with the patient to complete an updated/reassessment. Patient endorses SI with plan. He denies SI/HI and AV/H.

## 2021-07-27 NOTE — ED Notes (Signed)
VS not taken, patient asleep 

## 2021-07-27 NOTE — Plan of Care (Signed)
Patient states, that he is ready to quit using drugs and wants to go to a rehab facility.

## 2021-07-27 NOTE — ED Notes (Signed)
Hospital meal provided, pt tolerated w/o complaints.  Waste discarded appropriately.  

## 2021-07-27 NOTE — ED Notes (Signed)
Hourly rounding reveals patient in room. No complaints, stable, in no acute distress. Q15 minute rounds and monitoring via Security Cameras to continue. 

## 2021-07-27 NOTE — ED Notes (Signed)
Pt given lunch tray.

## 2021-07-27 NOTE — ED Notes (Signed)
Katie, RN attempted to call report to BMU with no answer from unit

## 2021-07-27 NOTE — ED Provider Notes (Signed)
Emergency Medicine Observation Re-evaluation Note  Steven Khan is a 40 y.o. male, seen on rounds today.  Pt initially presented to the ED for complaints of Drug Problem and Suicidal Currently, the patient is resting comfortably in bed and has no complaints.  Physical Exam  BP 120/78 (BP Location: Left Arm)   Pulse 73   Temp 98.1 F (36.7 C) (Oral)   Resp 18   Ht 6\' 1"  (1.854 m)   Wt 95.3 kg   SpO2 100%   BMI 27.72 kg/m  Physical Exam General: Calm and cooperative. Cardiac: Good peripheral perfusion. Lungs: Normal respiratory effort. Psych: Calm and cooperative.  ED Course / MDM  EKG:   I have reviewed the labs performed to date as well as medications administered while in observation.  There have been no changes to his status in the last 24 hours.  Plan  Current plan is for psychiatric admission.  Steven Khan is not under involuntary commitment.     [place] Bali, MD 07/27/21 1600

## 2021-07-27 NOTE — Progress Notes (Signed)
Pt is a 40 yr old male, with substance abuse (crack/cocaine) issues. The patient was running from someone in the community and has been using drugs to cope with his issues. He states that he is tired of using drugs, and wants to be the person that god created him to be. He wants to go to a treatment center. He feels safe and is thankful to be here. VS: 122/81, 98.8, 78, 99, 16. Complains of passive SI, related to his current life situation. He doesn't have a support system, nothing currently puts him in a happy place and uses drugs to relieve his stress, he has been using drugs 24 years. No sexual or physical abuse. Mental abuse since he has been using drugs. Will continue to support and provide for the patient until discharge.   Angelina Sheriff, RN

## 2021-07-27 NOTE — ED Notes (Addendum)
Attempted to call report to BMU with no answer from unit

## 2021-07-28 DIAGNOSIS — F332 Major depressive disorder, recurrent severe without psychotic features: Principal | ICD-10-CM

## 2021-07-28 MED ORDER — GABAPENTIN 300 MG PO CAPS: 300.0000 mg | ORAL_CAPSULE | Freq: Three times a day (TID) | ORAL | Status: DC

## 2021-07-28 MED ORDER — BACITRACIN-NEOMYCIN-POLYMYXIN OINTMENT TUBE
TOPICAL_OINTMENT | Freq: Two times a day (BID) | CUTANEOUS | Status: DC
  Administered 2021-07-29: 1 via TOPICAL
  Filled 2021-07-28: qty 14.17

## 2021-07-28 MED ORDER — ACETAMINOPHEN 325 MG PO TABS: 650.0000 mg | ORAL_TABLET | Freq: Four times a day (QID) | ORAL | Status: DC | PRN

## 2021-07-28 MED ORDER — MAGNESIUM HYDROXIDE 400 MG/5ML PO SUSP: 30.0000 mL | Freq: Every day | ORAL | Status: DC | PRN

## 2021-07-28 MED ORDER — HYDROXYZINE HCL 50 MG PO TABS
50.0000 mg | ORAL_TABLET | Freq: Three times a day (TID) | ORAL | Status: DC | PRN
  Administered 2021-07-29: 50 mg via ORAL
  Filled 2021-07-28: qty 1

## 2021-07-28 MED ORDER — ALUM & MAG HYDROXIDE-SIMETH 200-200-20 MG/5ML PO SUSP: 30.0000 mL | ORAL | Status: DC | PRN

## 2021-07-28 NOTE — Group Note (Signed)
[facility] LCSW Group Therapy Note    Group Date: 07/28/2021 Start Time: 1300 End Time: 1400  Type of Therapy and Topic:  Group Therapy:  Overcoming Obstacles  Participation Level:  [facility] PARTICIPATION LEVEL: Did Not Attend  Mood:  Description of Group:   In this group patients will be encouraged to explore what they see as obstacles to their own wellness and recovery. They will be guided to discuss their thoughts, feelings, and behaviors related to these obstacles. The group will process together ways to cope with barriers, with attention given to specific choices patients can make. Each patient will be challenged to identify changes they are motivated to make in order to overcome their obstacles. This group will be process-oriented, with patients participating in exploration of their own experiences as well as giving and receiving support and challenge from other group members.  Therapeutic Goals: 1. Patient will identify personal and current obstacles as they relate to admission. 2. Patient will identify barriers that currently interfere with their wellness or overcoming obstacles.  3. Patient will identify feelings, thought process and behaviors related to these barriers. 4. Patient will identify two changes they are willing to make to overcome these obstacles:    Summary of Patient Progress   X   Therapeutic Modalities:   Cognitive Behavioral Therapy Solution Focused Therapy Motivational Interviewing Relapse Prevention Therapy   Evani Shrider J Betzaida Cremeens, LCSW 

## 2021-07-28 NOTE — [facility] Suicide Risk Assessment (Signed)
[facility] INPATIENT:  Family/Significant Other Suicide Prevention Education  Suicide Prevention Education:  Patient Refusal for Family/Significant Other Suicide Prevention Education: The patient Steven Khan has refused to provide written consent for family/significant other to be provided Family/Significant Other Suicide Prevention Education during admission and/or prior to discharge.  Physician notified.  SPE completed with pt, as pt refused to consent to family contact stating he has no support. SPI pamphlet provided to pt and pt was encouraged to share information with support network, ask questions, and talk about any concerns relating to SPE. Pt denies access to guns/firearms and verbalized understanding of information provided. Mobile Crisis information also provided to pt.  Glenis Smoker 07/28/2021, 10:27 AM

## 2021-07-28 NOTE — BH IP Treatment Plan (Signed)
Interdisciplinary Treatment and Diagnostic Plan Update  07/28/2021 Time of Session: 9:00AM Steven Khan MRN: [id number]  Principal Diagnosis: <principal problem not specified>  Secondary Diagnoses: Active Problems:   * No active hospital problems. *   Current Medications:  Current Facility-Administered Medications  Medication Dose Route Frequency Provider Last Rate Last Admin   neomycin-bacitracin-polymyxin (NEOSPORIN) ointment   Topical BID Salley Scarlet, MD       nicotine (NICODERM CQ - dosed in mg/24 hours) patch 14 mg  14 mg Transdermal Q0600 Patrecia Pour, NP       PTA Medications: No medications prior to admission.    Patient Stressors:    Patient Strengths:    Treatment Modalities: Medication Management, Group therapy, Case management,  1 to 1 session with clinician, Psychoeducation, Recreational therapy.   Physician Treatment Plan for Primary Diagnosis: <principal problem not specified> Long Term Goal(s):     Short Term Goals:    Medication Management: Evaluate patient's response, side effects, and tolerance of medication regimen.  Therapeutic Interventions: 1 to 1 sessions, Unit Group sessions and Medication administration.  Evaluation of Outcomes: Not Met  Physician Treatment Plan for Secondary Diagnosis: Active Problems:   * No active hospital problems. *  Long Term Goal(s):     Short Term Goals:       Medication Management: Evaluate patient's response, side effects, and tolerance of medication regimen.  Therapeutic Interventions: 1 to 1 sessions, Unit Group sessions and Medication administration.  Evaluation of Outcomes: Not Met   RN Treatment Plan for Primary Diagnosis: <principal problem not specified> Long Term Goal(s): Knowledge of disease and therapeutic regimen to maintain health will improve  Short Term Goals: Ability to demonstrate self-control, Ability to participate in decision making will improve, Ability to verbalize feelings  will improve, Ability to identify and develop effective coping behaviors will improve, and Compliance with prescribed medications will improve  Medication Management: RN will administer medications as ordered by provider, will assess and evaluate patient's response and provide education to patient for prescribed medication. RN will report any adverse and/or side effects to prescribing provider.  Therapeutic Interventions: 1 on 1 counseling sessions, Psychoeducation, Medication administration, Evaluate responses to treatment, Monitor vital signs and CBGs as ordered, Perform/monitor CIWA, COWS, AIMS and Fall Risk screenings as ordered, Perform wound care treatments as ordered.  Evaluation of Outcomes: Not Met   LCSW Treatment Plan for Primary Diagnosis: <principal problem not specified> Long Term Goal(s): Safe transition to appropriate next level of care at discharge, Engage patient in therapeutic group addressing interpersonal concerns.  Short Term Goals: Engage patient in aftercare planning with referrals and resources, Increase social support, Increase ability to appropriately verbalize feelings, Increase emotional regulation, Facilitate acceptance of mental health diagnosis and concerns, and Facilitate patient progression through stages of change regarding substance use diagnoses and concerns  Therapeutic Interventions: Assess for all discharge needs, 1 to 1 time with Social worker, Explore available resources and support systems, Assess for adequacy in community support network, Educate family and significant other(s) on suicide prevention, Complete Psychosocial Assessment, Interpersonal group therapy.  Evaluation of Outcomes: Not Met   Progress in Treatment: Attending groups: No. Participating in groups: No. Taking medication as prescribed: Yes. Toleration medication: Yes. Family/Significant other contact made: No, will contact:  once permission given.  Patient understands diagnosis:  Yes. Discussing patient identified problems/goals with staff: Yes. Medical problems stabilized or resolved: Yes. Denies suicidal/homicidal ideation: Yes. Issues/concerns per patient self-inventory: No. Other: none  New problem(s) identified: No, Describe:  none  New Short Term/Long Term Goal(s): detox, elimination of symptoms of psychosis, medication management for mood stabilization; elimination of SI thoughts; development of comprehensive mental wellness/sobriety plan.   Patient Goals:  "getting my life back on track and stop using drugs"  Discharge Plan or Barriers: Patient reports that he is interested in [facility] for residential treatment.    Reason for Continuation of Hospitalization: Anxiety Depression Medication stabilization Withdrawal symptoms  Estimated Length of Stay:  1-7 days   Scribe for Treatment Team: Rozann Lesches, LCSW 07/28/2021 9:10 AM

## 2021-07-28 NOTE — [facility] Suicide Risk Assessment (Signed)
[facility] Admission Suicide Risk Assessment   Nursing information obtained from:  Patient Demographic factors:  Low socioeconomic status, Unemployed Current Mental Status:  Suicidal ideation indicated by patient Loss Factors:  Financial problems / change in socioeconomic status Historical Factors:  Family history of mental illness or substance abuse Risk Reduction Factors:  NA  Total Time spent with patient: 35 minutes- 25 minutes face-to-face contact with patient, 10 minutes documentation, coordination of care, scripts  Principal Problem: Major depressive disorder, recurrent severe without psychotic features (HCC) Diagnosis:  Principal Problem:   Major depressive disorder, recurrent severe without psychotic features (HCC) Active Problems:   PTSD (post-traumatic stress disorder)   Cocaine abuse (HCC)   Suicidal ideations  Subjective Data: 40 year old male with history of MDD and PTSD presenting with worsening depression and suicidal ideations with plan to overdose. He has been using cocaine with increasing frequency, and notes his depression has worsened since then. He feels depressed, hopeless, worthless, guilty, low energy, and has poor appetite. He has tried [employer] while in prison and did not find these helpful. He is looking to go to residential substance abuse treatment for the first time. He denies homicidal ideations, auditory hallucinations, or visual hallucinations. He was offered medications for treatment of MDD and PTSD, however, patient declined and wants to try therapy first.   Continued Clinical Symptoms:  Alcohol Use Disorder Identification Test Final Score (AUDIT): 0 The "Alcohol Use Disorders Identification Test", Guidelines for Use in Primary Care, Second Edition.  World Science writer [facility]). Score between 0-7:  no or low risk or alcohol related problems. Score between 8-15:  moderate risk of alcohol related problems. Score between 16-19:  high risk of alcohol  related problems. Score 20 or above:  warrants further diagnostic evaluation for alcohol dependence and treatment.   CLINICAL FACTORS:   Severe Anxiety and/or Agitation Depression:   Comorbid alcohol abuse/dependence Severe Alcohol/Substance Abuse/Dependencies Unstable or Poor Therapeutic Relationship Previous Psychiatric Diagnoses and Treatments   Musculoskeletal: Strength & Muscle Tone: within normal limits Gait & Station: normal Patient leans: N/A  Psychiatric Specialty Exam:  Presentation  General Appearance: Appropriate for Environment  Eye Contact:Fair  Speech:Normal Rate  Speech Volume:Normal  Handedness:Right   Mood and Affect  Mood:Depressed; Hopeless  Affect:Congruent   Thought Process  Thought Processes:Coherent; Goal Directed  Descriptions of Associations:Intact  Orientation:Full (Time, Place and Person)  Thought Content:Logical  History of Schizophrenia/Schizoaffective disorder:No  Duration of Psychotic Symptoms:No data recorded Hallucinations:Hallucinations: None  Ideas of Reference:None  Suicidal Thoughts:Suicidal Thoughts: Yes, Active  Homicidal Thoughts:Homicidal Thoughts: No   Sensorium  Memory: No data recorded Judgment:Intact  Insight:Present   Executive Functions  Concentration:Fair  Attention Span:Fair  Recall:Fair  Fund of Knowledge:Fair  Language:Fair   Psychomotor Activity  Psychomotor Activity:Psychomotor Activity: Normal   Assets  Assets:Communication Skills; Desire for Improvement; Physical Health; Resilience   Sleep  Sleep:Sleep: Good Number of Hours of Sleep: 8    Physical Exam: Physical Exam ROS Blood pressure 117/70, pulse 70, temperature 98.2 F (36.8 C), temperature source Oral, resp. rate 18, height 5\' 9"  (1.753 m), weight 97.1 kg, SpO2 96 %. Body mass index is 31.6 kg/m.   COGNITIVE FEATURES THAT CONTRIBUTE TO RISK:  Polarized thinking    SUICIDE RISK:   Mild:  Suicidal  ideation of limited frequency, intensity, duration, and specificity.  There are no identifiable plans, no associated intent, mild dysphoria and related symptoms, good self-control (both objective and subjective assessment), few other risk factors, and identifiable protective factors, including available  and accessible social support.  PLAN OF CARE: Continue inpatient admission, see H&P for details.   I certify that inpatient services furnished can reasonably be expected to improve the patient's condition.   Jesse Sans, MD 07/28/2021, 9:54 AM

## 2021-07-28 NOTE — Plan of Care (Signed)
  Problem: Education: Goal: Knowledge of [facility] Education information/materials will improve Outcome: Progressing Goal: Verbalization of understanding the information provided will improve Outcome: Progressing   Problem: Health Behavior/Discharge Planning: Goal: Compliance with treatment plan for underlying cause of condition will improve Outcome: Progressing   
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
## 2021-07-28 NOTE — Plan of Care (Signed)
  Problem: Education: Goal: Knowledge of [facility] Education information/materials will improve Outcome: Progressing Goal: Mental status will improve Outcome: Progressing Goal: Verbalization of understanding the information provided will improve Outcome: Progressing   Problem: Activity: Goal: Interest or engagement in activities will improve Outcome: Progressing Goal: Sleeping patterns will improve Outcome: Progressing   Problem: Coping: Goal: Ability to demonstrate self-control will improve Outcome: Progressing   Problem: Health Behavior/Discharge Planning: Goal: Identification of resources available to assist in meeting health care needs will improve Outcome: Progressing Goal: Compliance with treatment plan for underlying cause of condition will improve Outcome: Progressing   

## 2021-07-28 NOTE — Progress Notes (Signed)
Patient calm and compliant during assessment denying SI/HI/AVH. Patient observed interacting appropriately with staff and peers on the unit. Pt didn't have any medications scheduled tonight and hasn't requested anything PRN. Patient given education, support, and encouragement to be active in his treatment plan. Pt being monitored Q 15 minutes for safety per unit protocol. Pt remains safe on the unit.

## 2021-07-28 NOTE — Progress Notes (Signed)
Patient currently denies SI/HI/AVH. Patient denies pain. Patient was isolative to room. Patients mood was pleasant and interactive with staff upon approach. Q15 minute safety checks maintained. Patient remains safe on the unit at this time.
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
## 2021-07-28 NOTE — Progress Notes (Signed)
Recreation Therapy Notes  Date: 07/28/2021  Time: 9:50 am   Location: Craft room    Behavioral response: N/A   Intervention Topic: Goals  Discussion/Intervention: Patient did not attend group.   Clinical Observations/Feedback:  Patient did not attend group.   Katricia Prehn LRT/CTRS        Schylar Wuebker 07/28/2021 11:38 AM

## 2021-07-28 NOTE — Progress Notes (Signed)
Patient has been pleasant and cooperative. Denies SI, HI and AVH and contracts for safety at this time. He refused his COVID, flu and pneumonia injections, asking if he could have them later if he changed his mind. He refused his nicotine patch because he says he prefers the gum

## 2021-07-29 MED ORDER — NICOTINE 21 MG/24HR TD PT24
21.0000 mg | MEDICATED_PATCH | Freq: Every day | TRANSDERMAL | Status: DC
  Administered 2021-07-29: 21 mg via TRANSDERMAL
  Filled 2021-07-29: qty 1

## 2021-07-29 MED ORDER — NICOTINE POLACRILEX 2 MG MT GUM
2.0000 mg | CHEWING_GUM | OROMUCOSAL | Status: DC | PRN
  Administered 2021-07-29: 2 mg via ORAL
  Filled 2021-07-29: qty 1

## 2021-07-29 NOTE — Group Note (Signed)
[facility] LCSW Group Therapy Note   Group Date: 07/29/2021 Start Time: 1300 End Time: 1355  Type of Therapy/Topic:  Group Therapy:  Feelings about Diagnosis  Participation Level:  Did Not Attend   Description of Group:    This group will allow patients to explore their thoughts and feelings about diagnoses they have received. Patients will be guided to explore their level of understanding and acceptance of these diagnoses. Facilitator will encourage patients to process their thoughts and feelings about the reactions of others to their diagnosis, and will guide patients in identifying ways to discuss their diagnosis with significant others in their lives. This group will be process-oriented, with patients participating in exploration of their own experiences as well as giving and receiving support and challenge from other group members.   Therapeutic Goals: 1. Patient will demonstrate understanding of diagnosis as evidence by identifying two or more symptoms of the disorder:  2. Patient will be able to express two feelings regarding the diagnosis 3. Patient will demonstrate ability to communicate their needs through discussion and/or role plays  Summary of Patient Progress: X  Therapeutic Modalities:   Cognitive Behavioral Therapy Brief Therapy Feelings Identification    Payson Crumby R Patrick Sohm, LCSW 

## 2021-07-29 NOTE — Progress Notes (Signed)
[facility] MD Progress Note  07/29/2021 9:57 AM EWARD Khan  MRN:  [id number]  CC "I'm okay/"  Subjective:  40 year old male with history of MDD and PTSD presenting with worsening depression and suicidal ideations with plan to overdose. No acute events overnight, no medications scheduled, ADLs intact. Patient seen one-on-one this morning. He denies SI/HI/AH/VH. Continues to desire residential substance abuse treatment. CSW team assisting with applications.  Principal Problem: Major depressive disorder, recurrent severe without psychotic features (HCC) Diagnosis: Principal Problem:   Major depressive disorder, recurrent severe without psychotic features (HCC) Active Problems:   PTSD (post-traumatic stress disorder)   Cocaine abuse (HCC)   Suicidal ideations  Total Time spent with patient: 30 minutes  Past Psychiatric History: See H&P  Past Medical History: History reviewed. No pertinent past medical history.  Past Surgical History:  Procedure Laterality Date   HERNIA REPAIR     Family History: History reviewed. No pertinent family history. Family Psychiatric  History: See H&P Social History:  Social History   Substance and Sexual Activity  Alcohol Use Yes     Social History   Substance and Sexual Activity  Drug Use Yes   Types: Cocaine, Marijuana   Comment: ecstasy    Social History   Socioeconomic History   Marital status: Divorced    Spouse name: Not on file   Number of children: Not on file   Years of education: Not on file   Highest education level: Not on file  Occupational History   Not on file  Tobacco Use   Smoking status: Every Day    Packs/day: 1.00    Types: Cigarettes   Smokeless tobacco: Never  Vaping Use   Vaping Use: Never used  Substance and Sexual Activity   Alcohol use: Yes   Drug use: Yes    Types: Cocaine, Marijuana    Comment: ecstasy   Sexual activity: Not on file  Other Topics Concern   Not on file  Social History Narrative   Not  on file   Social Determinants of Health   Financial Resource Strain: Not on file  Food Insecurity: Not on file  Transportation Needs: Not on file  Physical Activity: Not on file  Stress: Not on file  Social Connections: Not on file   Additional Social History:                         Sleep: Fair  Appetite:  Fair  Current Medications: Current Facility-Administered Medications  Medication Dose Route Frequency Provider Last Rate Last Admin   acetaminophen (TYLENOL) tablet 650 mg  650 mg Oral Q6H PRN Charm Rings, NP       alum & mag hydroxide-simeth (MAALOX/MYLANTA) 200-200-20 MG/5ML suspension 30 mL  30 mL Oral Q4H PRN Charm Rings, NP       hydrOXYzine (ATARAX/VISTARIL) tablet 50 mg  50 mg Oral TID PRN Jesse Sans, MD       magnesium hydroxide (MILK OF MAGNESIA) suspension 30 mL  30 mL Oral Daily PRN Charm Rings, NP       neomycin-bacitracin-polymyxin (NEOSPORIN) ointment   Topical BID Jesse Sans, MD   Given at 07/28/21 1541   nicotine (NICODERM CQ - dosed in mg/24 hours) patch 21 mg  21 mg Transdermal Daily Jesse Sans, MD        Lab Results: No results found for this or any previous visit (from the past 48 hour(s)).  Blood Alcohol  level:  Lab Results  Component Value Date   ETH <10 07/26/2021   ETH <10 07/07/2020    Metabolic Disorder Labs: No results found for: HGBA1C, MPG No results found for: PROLACTIN Lab Results  Component Value Date   CHOL 206 (H) 06/01/2020   TRIG 70 06/01/2020   HDL 57 06/01/2020   CHOLHDL 3.6 06/01/2020   VLDL 14 06/01/2020   LDLCALC 135 (H) 06/01/2020    Physical Findings: AIMS:  , ,  ,  ,    CIWA:    COWS:     Musculoskeletal: Strength & Muscle Tone: within normal limits Gait & Station: normal Patient leans: N/A  Psychiatric Specialty Exam:  Presentation  General Appearance: Appropriate for Environment  Eye Contact:Fair  Speech:Normal Rate  Speech  Volume:Normal  Handedness:Right   Mood and Affect  Mood:Depressed; Hopeless  Affect:Congruent   Thought Process  Thought Processes:Coherent; Goal Directed  Descriptions of Associations:Intact  Orientation:Full (Time, Place and Person)  Thought Content:Logical  History of Schizophrenia/Schizoaffective disorder:No  Duration of Psychotic Symptoms:No data recorded Hallucinations:Hallucinations: None  Ideas of Reference:None  Suicidal Thoughts:Denies Homicidal Thoughts:Homicidal Thoughts: No   Sensorium  Memory: Fair Judgment:Intact  Insight:Present   Executive Functions  Concentration:Fair  Attention Span:Fair  Recall:Fair  Fund of Knowledge:Fair  Language:Fair   Psychomotor Activity  Psychomotor Activity:Psychomotor Activity: Normal   Assets  Assets:Communication Skills; Desire for Improvement; Physical Health; Resilience   Sleep  Sleep:Sleep: Good Number of Hours of Sleep: 8    Physical Exam: Physical Exam ROS Blood pressure 109/74, pulse 66, temperature 98.2 F (36.8 C), temperature source Oral, resp. rate 18, height 5\' 9"  (1.753 m), weight 97.1 kg, SpO2 99 %. Body mass index is 31.6 kg/m.   Treatment Plan Summary: Daily contact with patient to assess and evaluate symptoms and progress in treatment and Medication management51 year old male with history of MDD and PTSD presenting for worsening depression and suicidal ideations with plan to overdose in context of cocaine use. Patient desires residential substance abuse treatment at this time. He was offered medications for MDD and PTSD, but continues to decline at this time. He wishes to use counseling and group therapy only at this time.   24, MD 07/29/2021, 9:57 AM

## 2021-07-29 NOTE — Progress Notes (Signed)
Patient calm and compliant during assessment denying SI/HI/AVH. Patient observed interacting appropriately with staff and peers on the unit. Pt didn't have any medications scheduled tonight but did request a PRN to help with his anxiety and to help him sleep, check MAR. Patient given education, support, and encouragement to be active in his treatment plan. Pt being monitored Q 15 minutes for safety per unit protocol. Pt remains safe on the unit.

## 2021-07-29 NOTE — Progress Notes (Signed)
Patient endorses passive SI that comes and goes, but verbally contracts for safety while on the unit. Patient denies HI and AVH. Patient is pleasant and is observed to be interacting appropriately with staff and other patients on the unit. Patient asks if he can try nicotine gum instead of the patch. Patient came back to nurses station shortly after, stating that he wanted to try the patch because he did not like the taste of the nicotine gum. Patient is given support, encouragement, and education. Patient remains safe on the unit at this time.

## 2021-07-29 NOTE — [facility] Counselor (Signed)
CSW met with pt briefly to discuss disposition. He stated he was interested in the longest placement he could find. CSW gave pt application for [facility] to complete. He was informed that if he had any questions he could come find the CSW. Interaction ended without incident.   Pt later requested help with completing the application. CSW assisted with completion of paperwork for [facility] and also [facility]. CSW informed pt that applications should be sent off for every program at the same time to ensure that he got the same opportunity for each program versus waiting for one and losing potential time to apply to others if they decline him. He agreed. CSW also mentioned sending information to [facility] which pt agreed. No other concerns expressed. Contact ended without incident.   CSW faxed information to the following facilities:  [facility]: [phone]  [facility]: [place]: [phone]  CSW will follow up with pt regarding completion of application for [facility] due to financial component.   Chalmers Guest. Guerry Bruin, MSW, [place], [place] 07/29/2021 4:07 PM

## 2021-07-29 NOTE — Progress Notes (Signed)
Recreation Therapy Notes   Date: 07/29/2021  Time: 10:00 am   Location: Craft room    Behavioral response: N/A   Intervention Topic: Problem-Solving  Discussion/Intervention: Patient did not attend group.   Clinical Observations/Feedback:  Patient did not attend group.   Justun Anaya LRT/CTRS         Jakyah Bradby 07/29/2021 11:38 AM

## 2021-07-30 DIAGNOSIS — F332 Major depressive disorder, recurrent severe without psychotic features: Secondary | ICD-10-CM | POA: Diagnosis not present

## 2021-07-30 NOTE — Progress Notes (Signed)
Recreation Therapy Notes   Date: 07/30/2021  Time: 9:50 am   Location: Courtyard   Behavioral response: N/A   Intervention Topic: Leisure  Discussion/Intervention: Patient did not attend group.   Clinical Observations/Feedback:  Patient did not attend group.   Kalina Morabito LRT/CTRS        Zyeir Dymek 07/30/2021 11:10 AM

## 2021-07-30 NOTE — Progress Notes (Signed)
[facility] MD Progress Note  07/30/2021 10:20 AM Steven Khan  MRN:  [id number]  CC "Just tired."  Subjective:  40 year old male with history of MDD and PTSD presenting with worsening depression and suicidal ideations with plan to overdose. No acute events overnight, no medications scheduled, ADLs intact. Patient seen one-on-one today. Denies SI/HI/AH/VH. Slept well, eating well. Continues to desire residential substance abuse treatment. Numerous applications sent via CSW team. Awaiting dispo.  Principal Problem: Major depressive disorder, recurrent severe without psychotic features (HCC) Diagnosis: Principal Problem:   Major depressive disorder, recurrent severe without psychotic features (HCC) Active Problems:   PTSD (post-traumatic stress disorder)   Cocaine abuse (HCC)   Suicidal ideations  Total Time spent with patient: 20 minutes  Past Psychiatric History: See H&P  Past Medical History: History reviewed. No pertinent past medical history.  Past Surgical History:  Procedure Laterality Date   HERNIA REPAIR     Family History: History reviewed. No pertinent family history. Family Psychiatric  History: See H&P Social History:  Social History   Substance and Sexual Activity  Alcohol Use Yes     Social History   Substance and Sexual Activity  Drug Use Yes   Types: Cocaine, Marijuana   Comment: ecstasy    Social History   Socioeconomic History   Marital status: Divorced    Spouse name: Not on file   Number of children: Not on file   Years of education: Not on file   Highest education level: Not on file  Occupational History   Not on file  Tobacco Use   Smoking status: Every Day    Packs/day: 1.00    Types: Cigarettes   Smokeless tobacco: Never  Vaping Use   Vaping Use: Never used  Substance and Sexual Activity   Alcohol use: Yes   Drug use: Yes    Types: Cocaine, Marijuana    Comment: ecstasy   Sexual activity: Not on file  Other Topics Concern   Not on  file  Social History Narrative   Not on file   Social Determinants of Health   Financial Resource Strain: Not on file  Food Insecurity: Not on file  Transportation Needs: Not on file  Physical Activity: Not on file  Stress: Not on file  Social Connections: Not on file   Additional Social History:                         Sleep: Fair  Appetite:  Fair  Current Medications: Current Facility-Administered Medications  Medication Dose Route Frequency Provider Last Rate Last Admin   acetaminophen (TYLENOL) tablet 650 mg  650 mg Oral Q6H PRN Charm Rings, NP       alum & mag hydroxide-simeth (MAALOX/MYLANTA) 200-200-20 MG/5ML suspension 30 mL  30 mL Oral Q4H PRN Charm Rings, NP       hydrOXYzine (ATARAX/VISTARIL) tablet 50 mg  50 mg Oral TID PRN Jesse Sans, MD   50 mg at 07/29/21 2104   magnesium hydroxide (MILK OF MAGNESIA) suspension 30 mL  30 mL Oral Daily PRN Charm Rings, NP       neomycin-bacitracin-polymyxin (NEOSPORIN) ointment   Topical BID Jesse Sans, MD   1 application at 07/29/21 1518   nicotine (NICODERM CQ - dosed in mg/24 hours) patch 21 mg  21 mg Transdermal Daily Jesse Sans, MD   21 mg at 07/29/21 1136    Lab Results: No results found for this  or any previous visit (from the past 48 hour(s)).  Blood Alcohol level:  Lab Results  Component Value Date   ETH <10 07/26/2021   ETH <10 07/07/2020    Metabolic Disorder Labs: No results found for: HGBA1C, MPG No results found for: PROLACTIN Lab Results  Component Value Date   CHOL 206 (H) 06/01/2020   TRIG 70 06/01/2020   HDL 57 06/01/2020   CHOLHDL 3.6 06/01/2020   VLDL 14 06/01/2020   LDLCALC 135 (H) 06/01/2020    Physical Findings: AIMS:  , ,  ,  ,    CIWA:    COWS:     Musculoskeletal: Strength & Muscle Tone: within normal limits Gait & Station: normal Patient leans: N/A  Psychiatric Specialty Exam:  Presentation  General Appearance: Appropriate for  Environment  Eye Contact:Fair  Speech:Normal Rate  Speech Volume:Normal  Handedness:Right   Mood and Affect  Mood:Depressed; Hopeless  Affect:Congruent   Thought Process  Thought Processes:Coherent; Goal Directed  Descriptions of Associations:Intact  Orientation:Full (Time, Place and Person)  Thought Content:Logical  History of Schizophrenia/Schizoaffective disorder:No  Duration of Psychotic Symptoms:N.A Hallucinations:Denies  Ideas of Reference:None  Suicidal Thoughts:Denies Homicidal Thoughts:Denies   Sensorium  Memory: Fair Judgment:Intact  Insight:Present   Executive Functions  Concentration:Fair  Attention Span:Fair  Recall:Fair  Fund of Knowledge:Fair  Language:Fair   Psychomotor Activity  Psychomotor Activity:No data recorded   Assets  Assets:Communication Skills; Desire for Improvement; Physical Health; Resilience   Sleep  Sleep:No data recorded    Physical Exam: Physical Exam ROS Blood pressure 124/74, pulse 61, temperature 98.1 F (36.7 C), temperature source Oral, resp. rate 18, height 5\' 9"  (1.753 m), weight 97.1 kg, SpO2 100 %. Body mass index is 31.6 kg/m.   Treatment Plan Summary: Daily contact with patient to assess and evaluate symptoms and progress in treatment and Medication management40 year old male with history of MDD and PTSD presenting for worsening depression and suicidal ideations with plan to overdose in context of cocaine use. Patient desires residential substance abuse treatment at this time. He was offered medications for MDD and PTSD, but continues to decline at this time. He wishes to use counseling and group therapy only at this time.   07/30/21; Psychiatric exam above reviewed and remains accurate. Assessment and plan above reviewed and updated.    09/29/21, MD 07/30/2021, 10:20 AM

## 2021-07-30 NOTE — Progress Notes (Signed)
Recreation Therapy Notes  INPATIENT RECREATION TR PLAN  Patient Details Name: ILAI HILLER MRN: [id number] DOB: 1981/09/25 Today's Date: 07/30/2021  Rec Therapy Plan Is patient appropriate for Therapeutic Recreation?: Yes Treatment times per week: at least 3 Estimated Length of Stay: 5-7 days TR Treatment/Interventions: Group participation (Comment)  Discharge Criteria Pt will be discharged from therapy if:: Discharged Treatment plan/goals/alternatives discussed and agreed upon by:: Patient/family  Discharge Summary     Lorenia Hoston 07/30/2021, 9:14 AM

## 2021-07-30 NOTE — [facility] Counselor (Signed)
CSW contacted the following places for pt's inpatient referral:  [facility]: Referral was received by facility. They stated that pt needed to contact [facility] for a prescreen for admission. CSW will inform pt and provide contact information so pt can complete prescreen this afternoon.  [facility]: CSW contacted facility and had to leave a voicemail. CSW will follow up in a timely manner regarding pt application. [facility]. Referral was received by facility. CSW contacted facility and talked with Joe in admissions. He stated he would follow up with pt regarding application status once the clinical and medical teams have met and made a determination.    Nazanin Kinner [place], MSW, LCSW-A 10/12/20221:06 PM

## 2021-07-30 NOTE — Plan of Care (Signed)
  Problem: Education: Goal: Verbalization of understanding the information provided will improve Outcome: Progressing   Problem: Health Behavior/Discharge Planning: Goal: Compliance with treatment plan for underlying cause of condition will improve Outcome: Progressing   

## 2021-07-30 NOTE — Progress Notes (Signed)
Patient currently denies SI/HI/AVH. Patient seen interacting with peers and walking around the unit. Patients mood was pleasant and interactive with staff upon approach. Q15 minute safety checks maintained. Patient remains safe on the unit at this time.

## 2021-07-30 NOTE — Group Note (Signed)
[facility] LCSW Group Therapy Note   Group Date: 07/30/2021 Start Time: 1300 End Time: 1400   Type of Therapy/Topic:  Group Therapy:  Emotion Regulation  Participation Level:    Did Not Attend   Description of Group:    The purpose of this group is to assist patients in learning to regulate negative emotions and experience positive emotions. Patients will be guided to discuss ways in which they have been vulnerable to their negative emotions. These vulnerabilities will be juxtaposed with experiences of positive emotions or situations, and patients challenged to use positive emotions to combat negative ones. Special emphasis will be placed on coping with negative emotions in conflict situations, and patients will process healthy conflict resolution skills.  Therapeutic Goals: Patient will identify two positive emotions or experiences to reflect on in order to balance out negative emotions:  Patient will label two or more emotions that they find the most difficult to experience:  Patient will be able to demonstrate positive conflict resolution skills through discussion or role plays:   Summary of Patient Progress:  X    Therapeutic Modalities:   Cognitive Behavioral Therapy Feelings Identification Dialectical Behavioral Therapy   Jil Penland A Linus Weckerly, LCSWA 

## 2021-07-30 NOTE — Progress Notes (Signed)
Recreation Therapy Notes  INPATIENT RECREATION THERAPY ASSESSMENT  Patient Details Name: Steven Khan MRN: [id number] DOB: 04-26-1981 Today's Date: 07/30/2021       Information Obtained From: Patient  Able to Participate in Assessment/Interview: Yes  Patient Presentation: Responsive  Reason for Admission (Per Patient): Active Symptoms, Substance Abuse  Patient Stressors:    Coping Skills:   Substance Abuse, Read, Music, Prayer  Leisure Interests (2+):  Music - Listen, Sports - Basketball  Frequency of Recreation/Participation: Monthly  Awareness of Community Resources:  No  Community Resources:     Current Use:    If no, Barriers?:    Expressed Interest in [employer] Information: No  [place]idence[place]  Patient Main Form of Transportation: Walk  Patient Strengths:  N/A  Patient Identified Areas of Improvement:  Getting and staying clean  Patient Goal for Hospitalization:  Get into treatment  Current SI (including self-harm):  No  Current HI:  No  Current AVH: No  Staff Intervention Plan: Group Attendance, Collaborate with Interdisciplinary Treatment Team  Consent to Intern Participation: N/A  Steven Khan 07/30/2021, 9:13 AM

## 2021-07-30 NOTE — Progress Notes (Signed)
Patient provided contact information to [facility] and observed at phone. CSW will follow up w/ patient afterwards.    Signed:  Corky Crafts, MSW, [place], LCASA 07/30/2021 1:26 PM

## 2021-07-31 DIAGNOSIS — F332 Major depressive disorder, recurrent severe without psychotic features: Secondary | ICD-10-CM | POA: Diagnosis not present

## 2021-07-31 NOTE — Progress Notes (Signed)
Patient is observed in the day room watching TV with others. Pt denies SI, HI, and AVH at this time. Pt voiced no complaints to this Clinical research associate. Pt remains safe on the unit at this time.

## 2021-07-31 NOTE — Progress Notes (Signed)
Recreation Therapy Notes  Date: 07/31/2021  Time: 10:30 am   Location: Craft room    Behavioral response: N/A   Intervention Topic: Decision Making   Discussion/Intervention: Patient did not attend group.   Clinical Observations/Feedback:  Patient did not attend group.   Caral Whan LRT/CTRS          Llewyn Heap 07/31/2021 11:17 AM

## 2021-07-31 NOTE — Group Note (Signed)

## 2021-07-31 NOTE — Progress Notes (Signed)
[facility] MD Progress Note  07/31/2021 10:09 AM DON TIU  MRN:  [id number]  CC "I'm okay"  Subjective:  40 year old male with history of MDD and PTSD presenting with worsening depression and suicidal ideations with plan to overdose. No acute events overnight, no medications scheduled, ADLs intact. Patient seen one-on-one today. He denies SI/HI/AH/VH. Prescreen interview with [facility] completed. Applications also pending at [facility] and [facility].  Principal Problem: Major depressive disorder, recurrent severe without psychotic features (HCC) Diagnosis: Principal Problem:   Major depressive disorder, recurrent severe without psychotic features (HCC) Active Problems:   PTSD (post-traumatic stress disorder)   Cocaine abuse (HCC)   Suicidal ideations  Total Time spent with patient: 20 minutes  Past Psychiatric History: See H&P  Past Medical History: History reviewed. No pertinent past medical history.  Past Surgical History:  Procedure Laterality Date   HERNIA REPAIR     Family History: History reviewed. No pertinent family history. Family Psychiatric  History: See H&P Social History:  Social History   Substance and Sexual Activity  Alcohol Use Yes     Social History   Substance and Sexual Activity  Drug Use Yes   Types: Cocaine, Marijuana   Comment: ecstasy    Social History   Socioeconomic History   Marital status: Divorced    Spouse name: Not on file   Number of children: Not on file   Years of education: Not on file   Highest education level: Not on file  Occupational History   Not on file  Tobacco Use   Smoking status: Every Day    Packs/day: 1.00    Types: Cigarettes   Smokeless tobacco: Never  Vaping Use   Vaping Use: Never used  Substance and Sexual Activity   Alcohol use: Yes   Drug use: Yes    Types: Cocaine, Marijuana    Comment: ecstasy   Sexual activity: Not on file  Other Topics Concern   Not on file  Social History Narrative   Not on file    Social Determinants of Health   Financial Resource Strain: Not on file  Food Insecurity: Not on file  Transportation Needs: Not on file  Physical Activity: Not on file  Stress: Not on file  Social Connections: Not on file   Additional Social History:                         Sleep: Fair  Appetite:  Fair  Current Medications: Current Facility-Administered Medications  Medication Dose Route Frequency Provider Last Rate Last Admin   acetaminophen (TYLENOL) tablet 650 mg  650 mg Oral Q6H PRN Charm Rings, NP       alum & mag hydroxide-simeth (MAALOX/MYLANTA) 200-200-20 MG/5ML suspension 30 mL  30 mL Oral Q4H PRN Charm Rings, NP       hydrOXYzine (ATARAX/VISTARIL) tablet 50 mg  50 mg Oral TID PRN Jesse Sans, MD   50 mg at 07/29/21 2104   magnesium hydroxide (MILK OF MAGNESIA) suspension 30 mL  30 mL Oral Daily PRN Charm Rings, NP       neomycin-bacitracin-polymyxin (NEOSPORIN) ointment   Topical BID Jesse Sans, MD   Given at 07/30/21 1227   nicotine (NICODERM CQ - dosed in mg/24 hours) patch 21 mg  21 mg Transdermal Daily Jesse Sans, MD   21 mg at 07/29/21 1136    Lab Results: No results found for this or any previous visit (from the  past 48 hour(s)).  Blood Alcohol level:  Lab Results  Component Value Date   ETH <10 07/26/2021   ETH <10 07/07/2020    Metabolic Disorder Labs: No results found for: HGBA1C, MPG No results found for: PROLACTIN Lab Results  Component Value Date   CHOL 206 (H) 06/01/2020   TRIG 70 06/01/2020   HDL 57 06/01/2020   CHOLHDL 3.6 06/01/2020   VLDL 14 06/01/2020   LDLCALC 135 (H) 06/01/2020    Physical Findings: AIMS:  , ,  ,  ,    CIWA:    COWS:     Musculoskeletal: Strength & Muscle Tone: within normal limits Gait & Station: normal Patient leans: N/A  Psychiatric Specialty Exam:  Presentation  General Appearance: Appropriate for Environment  Eye Contact:Fair  Speech:Normal Rate  Speech  Volume:Normal  Handedness:Right   Mood and Affect  Mood:Depressed; Hopeless  Affect:Congruent   Thought Process  Thought Processes:Coherent; Goal Directed  Descriptions of Associations:Intact  Orientation:Full (Time, Place and Person)  Thought Content:Logical  History of Schizophrenia/Schizoaffective disorder:No  Duration of Psychotic Symptoms:N.A Hallucinations:Denies  Ideas of Reference:None  Suicidal Thoughts:Denies Homicidal Thoughts:Denies   Sensorium  Memory: Fair Judgment:Intact  Insight:Present   Executive Functions  Concentration:Fair  Attention Span:Fair  Recall:Fair  Fund of Knowledge:Fair  Language:Fair   Psychomotor Activity  Psychomotor Activity:No data recorded   Assets  Assets:Communication Skills; Desire for Improvement; Physical Health; Resilience   Sleep  Sleep:No data recorded    Physical Exam: Physical Exam ROS Blood pressure 111/67, pulse 62, temperature 98 F (36.7 C), temperature source Oral, resp. rate 18, height 5\' 9"  (1.753 m), weight 97.1 kg, SpO2 100 %. Body mass index is 31.6 kg/m.   Treatment Plan Summary: Daily contact with patient to assess and evaluate symptoms and progress in treatment and Medication management40 year old male with history of MDD and PTSD presenting for worsening depression and suicidal ideations with plan to overdose in context of cocaine use. Patient desires residential substance abuse treatment at this time. He was offered medications for MDD and PTSD, but continues to decline at this time. He wishes to use counseling and group therapy only at this time.   07/31/21: Psychiatric exam above reviewed and remains accurate. Assessment and plan above reviewed and updated.     08/02/21, MD 07/31/2021, 10:09 AM

## 2021-08-01 DIAGNOSIS — F332 Major depressive disorder, recurrent severe without psychotic features: Secondary | ICD-10-CM | POA: Diagnosis not present

## 2021-08-01 NOTE — Plan of Care (Signed)
Patient pleasant and cooperative on approach. Appropriate with staff & peers. Denies SI,HI and AVH. Appetite and energy level good. ADLs maintained. Support and encouragement given.

## 2021-08-01 NOTE — [facility] Counselor (Signed)
CSW called the [facility] program and spoke with Delanna Ahmadi.  He reports that he would like to admit the patient on 08/04/2021 at 9AM.  CSW asked if time could bne pushed to 10 due to meetings and he reports that he will call back to confirm though he thought 10AM would work fine.    [facility] will be providing transportation.  Patient will need 7 day supply of medication and all his belongings at that time.  Penni Homans, MSW, LCSW 08/01/2021 9:37 AM

## 2021-08-01 NOTE — Progress Notes (Signed)
D: Patient voiced no complaints to this Clinical research associate. Pt is calm and cooperative. Pt denies SI, HI and AVH at this time.   A: Encouraged pt to come to staff with any concerns.   R: Pt remains safe on the unit at this time.    07/31/21 2300  Psych Admission Type (Psych Patients Only)  Admission Status Voluntary  Psychosocial Assessment  Patient Complaints None  Eye Contact Fair  Facial Expression Flat  Affect Appropriate to circumstance  Speech Logical/coherent  Interaction Minimal  Motor Activity Slow  Appearance/Hygiene Unremarkable  Behavior Characteristics Cooperative  Mood Pleasant  Aggressive Behavior  Effect No apparent injury  Thought Process  Coherency WDL  Content WDL  Delusions None reported or observed  Perception WDL  Hallucination None reported or observed  Judgment WDL  Confusion None  Danger to Self  Current suicidal ideation? Denies  Self-Injurious Behavior No self-injurious ideation or behavior indicators observed or expressed   Agreement Not to Harm Self Yes  Description of Agreement verbal  Danger to Others  Danger to Others None reported or observed

## 2021-08-01 NOTE — Progress Notes (Signed)
Recreation Therapy Notes  Date: 08/01/2021  Time: 10:00 am   Location: Craft room    Behavioral response: N/A   Intervention Topic: Time Management   Discussion/Intervention: Patient did not attend group.   Clinical Observations/Feedback:  Patient did not attend group.   Vern Prestia LRT/CTRS        Sadiyah Kangas 08/01/2021 11:32 AM

## 2021-08-01 NOTE — Progress Notes (Signed)
Patient sitting in dayroom watching television and socializing appropriately with peers; pleasant and cooperative. No complaints at this time. Denies SI/HI/AVH. Remains safe on the into with q15 minute safety checks.

## 2021-08-01 NOTE — Progress Notes (Signed)
[facility] MD Progress Note  08/01/2021 10:36 AM Steven Khan  MRN:  [id number]  CC "I'm good"  Subjective:  40 year old male with history of MDD and PTSD presenting with worsening depression and suicidal ideations with plan to overdose. No acute events overnight, no medications scheduled, ADLs intact. Patient seen one-on-one today. He denies SI/HI/AH/VH. He has been accepted to [facility], and program will be picking patient up Monday, October 17th at 10AM.   Principal Problem: Major depressive disorder, recurrent severe without psychotic features (HCC) Diagnosis: Principal Problem:   Major depressive disorder, recurrent severe without psychotic features (HCC) Active Problems:   PTSD (post-traumatic stress disorder)   Cocaine abuse (HCC)   Suicidal ideations  Total Time spent with patient: 20 minutes  Past Psychiatric History: See H&P  Past Medical History: History reviewed. No pertinent past medical history.  Past Surgical History:  Procedure Laterality Date   HERNIA REPAIR     Family History: History reviewed. No pertinent family history. Family Psychiatric  History: See H&P Social History:  Social History   Substance and Sexual Activity  Alcohol Use Yes     Social History   Substance and Sexual Activity  Drug Use Yes   Types: Cocaine, Marijuana   Comment: ecstasy    Social History   Socioeconomic History   Marital status: Divorced    Spouse name: Not on file   Number of children: Not on file   Years of education: Not on file   Highest education level: Not on file  Occupational History   Not on file  Tobacco Use   Smoking status: Every Day    Packs/day: 1.00    Types: Cigarettes   Smokeless tobacco: Never  Vaping Use   Vaping Use: Never used  Substance and Sexual Activity   Alcohol use: Yes   Drug use: Yes    Types: Cocaine, Marijuana    Comment: ecstasy   Sexual activity: Not on file  Other Topics Concern   Not on file  Social History Narrative   Not on  file   Social Determinants of Health   Financial Resource Strain: Not on file  Food Insecurity: Not on file  Transportation Needs: Not on file  Physical Activity: Not on file  Stress: Not on file  Social Connections: Not on file   Additional Social History:                         Sleep: Fair  Appetite:  Fair  Current Medications: Current Facility-Administered Medications  Medication Dose Route Frequency Provider Last Rate Last Admin   acetaminophen (TYLENOL) tablet 650 mg  650 mg Oral Q6H PRN Charm Rings, NP       alum & mag hydroxide-simeth (MAALOX/MYLANTA) 200-200-20 MG/5ML suspension 30 mL  30 mL Oral Q4H PRN Charm Rings, NP       hydrOXYzine (ATARAX/VISTARIL) tablet 50 mg  50 mg Oral TID PRN Jesse Sans, MD   50 mg at 07/29/21 2104   magnesium hydroxide (MILK OF MAGNESIA) suspension 30 mL  30 mL Oral Daily PRN Charm Rings, NP       neomycin-bacitracin-polymyxin (NEOSPORIN) ointment   Topical BID Jesse Sans, MD   Given at 08/01/21 0819   nicotine (NICODERM CQ - dosed in mg/24 hours) patch 21 mg  21 mg Transdermal Daily Jesse Sans, MD   21 mg at 07/29/21 1136    Lab Results: No results found for this  or any previous visit (from the past 48 hour(s)).  Blood Alcohol level:  Lab Results  Component Value Date   ETH <10 07/26/2021   ETH <10 07/07/2020    Metabolic Disorder Labs: No results found for: HGBA1C, MPG No results found for: PROLACTIN Lab Results  Component Value Date   CHOL 206 (H) 06/01/2020   TRIG 70 06/01/2020   HDL 57 06/01/2020   CHOLHDL 3.6 06/01/2020   VLDL 14 06/01/2020   LDLCALC 135 (H) 06/01/2020    Physical Findings: AIMS:  , ,  ,  ,    CIWA:    COWS:     Musculoskeletal: Strength & Muscle Tone: within normal limits Gait & Station: normal Patient leans: N/A  Psychiatric Specialty Exam:  Presentation  General Appearance: Appropriate for Environment  Eye Contact:Fair  Speech:Normal  Rate  Speech Volume:Normal  Handedness:Right   Mood and Affect  Mood:Euthymic  Affect:Congruent   Thought Process  Thought Processes:Coherent; Goal Directed  Descriptions of Associations:Intact  Orientation:Full (Time, Place and Person)  Thought Content:Logical  History of Schizophrenia/Schizoaffective disorder:No  Duration of Psychotic Symptoms:N.A Hallucinations:Denies  Ideas of Reference:None  Suicidal Thoughts:Denies Homicidal Thoughts:Denies   Sensorium  Memory: Fair Judgment:Intact  Insight:Present   Executive Functions  Concentration:Fair  Attention Span:Fair  Recall:Fair  Fund of Knowledge:Fair  Language:Fair   Psychomotor Activity  Psychomotor Activity:Normal   Assets  Assets:Communication Skills; Desire for Improvement; Physical Health; Resilience   Sleep  Sleep:Good, 8.25 hours    Physical Exam: Physical Exam ROS Blood pressure 121/87, pulse (!) 56, temperature 98.2 F (36.8 C), temperature source Oral, resp. rate 17, height 5\' 9"  (1.753 m), weight 97.1 kg, SpO2 100 %. Body mass index is 31.6 kg/m.   Treatment Plan Summary: Daily contact with patient to assess and evaluate symptoms and progress in treatment and Medication management34 year old male with history of MDD and PTSD presenting for worsening depression and suicidal ideations with plan to overdose in context of cocaine use. Patient desires residential substance abuse treatment at this time. He has been accepted to [facility], and will discharge Monday, October 17th, at 10AM.   08/01/21: Psychiatric exam above reviewed and remains accurate. Assessment and plan above reviewed and updated.      08/03/21, MD 08/01/2021, 10:36 AM

## 2021-08-01 NOTE — [facility] Counselor (Signed)
CSW met with patient at bedside to [facility]. Patient was given hand out with program descriptions. CSW answered any questions patient had about the program. No further action.   Signed:  Durenda Hurt, MSW, [place], LCASA 08/01/2021 10:33 AM

## 2021-08-01 NOTE — Group Note (Signed)

## 2021-08-02 DIAGNOSIS — F332 Major depressive disorder, recurrent severe without psychotic features: Secondary | ICD-10-CM | POA: Diagnosis not present

## 2021-08-02 NOTE — BH IP Treatment Plan (Signed)
Interdisciplinary Treatment and Diagnostic Plan Update  08/02/2021 Time of Session: 11:30AM Steven Khan MRN: [id number]  Principal Diagnosis: Major depressive disorder, recurrent severe without psychotic features ([place])  Secondary Diagnoses: Principal Problem:   Major depressive disorder, recurrent severe without psychotic features ([place]) Active Problems:   PTSD (post-traumatic stress disorder)   Cocaine abuse ([place])   Suicidal ideations   Current Medications:  Current Facility-Administered Medications  Medication Dose Route Frequency Provider Last Rate Last Admin   acetaminophen (TYLENOL) tablet 650 mg  650 mg Oral Q6H PRN Patrecia Pour, NP       alum & mag hydroxide-simeth (MAALOX/MYLANTA) 200-200-20 MG/5ML suspension 30 mL  30 mL Oral Q4H PRN Patrecia Pour, NP       hydrOXYzine (ATARAX/VISTARIL) tablet 50 mg  50 mg Oral TID PRN Salley Scarlet, MD   50 mg at 07/29/21 2104   magnesium hydroxide (MILK OF MAGNESIA) suspension 30 mL  30 mL Oral Daily PRN Patrecia Pour, NP       neomycin-bacitracin-polymyxin (NEOSPORIN) ointment   Topical BID Salley Scarlet, MD   Given at 08/01/21 0819   nicotine (NICODERM CQ - dosed in mg/24 hours) patch 21 mg  21 mg Transdermal Daily Salley Scarlet, MD   21 mg at 07/29/21 1136   PTA Medications: No medications prior to admission.    Patient Stressors:    Patient Strengths:    Treatment Modalities: Medication Management, Group therapy, Case management,  1 to 1 session with clinician, Psychoeducation, Recreational therapy.   Physician Treatment Plan for Primary Diagnosis: Major depressive disorder, recurrent severe without psychotic features ([place]) Long Term Goal(s): Improvement in symptoms so as ready for discharge   Short Term Goals: Ability to identify changes in lifestyle to reduce recurrence of condition will improve Ability to verbalize feelings will improve Ability to disclose and discuss suicidal ideas Ability to  demonstrate self-control will improve Ability to identify and develop effective coping behaviors will improve Ability to maintain clinical measurements within normal limits will improve Compliance with prescribed medications will improve Ability to identify triggers associated with substance abuse/mental health issues will improve  Medication Management: Evaluate patient's response, side effects, and tolerance of medication regimen.  Therapeutic Interventions: 1 to 1 sessions, Unit Group sessions and Medication administration.  Evaluation of Outcomes: Not Met  Physician Treatment Plan for Secondary Diagnosis: Principal Problem:   Major depressive disorder, recurrent severe without psychotic features ([place]) Active Problems:   PTSD (post-traumatic stress disorder)   Cocaine abuse ([place])   Suicidal ideations  Long Term Goal(s): Improvement in symptoms so as ready for discharge   Short Term Goals: Ability to identify changes in lifestyle to reduce recurrence of condition will improve Ability to verbalize feelings will improve Ability to disclose and discuss suicidal ideas Ability to demonstrate self-control will improve Ability to identify and develop effective coping behaviors will improve Ability to maintain clinical measurements within normal limits will improve Compliance with prescribed medications will improve Ability to identify triggers associated with substance abuse/mental health issues will improve     Medication Management: Evaluate patient's response, side effects, and tolerance of medication regimen.  Therapeutic Interventions: 1 to 1 sessions, Unit Group sessions and Medication administration.  Evaluation of Outcomes: Not Met   RN Treatment Plan for Primary Diagnosis: Major depressive disorder, recurrent severe without psychotic features ([place]) Long Term Goal(s): Knowledge of disease and therapeutic regimen to maintain health will improve  Short Term Goals: Ability to  demonstrate self-control, Ability to  participate in decision making will improve, Ability to verbalize feelings will improve, Ability to identify and develop effective coping behaviors will improve, and Compliance with prescribed medications will improve  Medication Management: RN will administer medications as ordered by provider, will assess and evaluate patient's response and provide education to patient for prescribed medication. RN will report any adverse and/or side effects to prescribing provider.  Therapeutic Interventions: 1 on 1 counseling sessions, Psychoeducation, Medication administration, Evaluate responses to treatment, Monitor vital signs and CBGs as ordered, Perform/monitor CIWA, COWS, AIMS and Fall Risk screenings as ordered, Perform wound care treatments as ordered.  Evaluation of Outcomes: Not Met   LCSW Treatment Plan for Primary Diagnosis: Major depressive disorder, recurrent severe without psychotic features ([place]) Long Term Goal(s): Safe transition to appropriate next level of care at discharge, Engage patient in therapeutic group addressing interpersonal concerns.  Short Term Goals: Engage patient in aftercare planning with referrals and resources, Increase social support, Increase ability to appropriately verbalize feelings, Increase emotional regulation, Facilitate acceptance of mental health diagnosis and concerns, and Facilitate patient progression through stages of change regarding substance use diagnoses and concerns  Therapeutic Interventions: Assess for all discharge needs, 1 to 1 time with Social worker, Explore available resources and support systems, Assess for adequacy in community support network, Educate family and significant other(s) on suicide prevention, Complete Psychosocial Assessment, Interpersonal group therapy.  Evaluation of Outcomes: Progressing   Progress in Treatment: Attending groups: No. Participating in groups: No. Taking medication as  prescribed: No. Toleration medication: No.  Family/Significant other contact made: No, will contact:  Contact will be made when patient provides consent to contact family/significant other. SPE completed with patient. Patient understands diagnosis: Yes. Discussing patient identified problems/goals with staff: Yes. Medical problems stabilized or resolved: Yes. Denies suicidal/homicidal ideation: Yes. Issues/concerns per patient self-inventory: No. Other: none  New problem(s) identified: No, Describe:  none  New Short Term/Long Term Goal(s): detox, elimination of symptoms of psychosis, medication management for mood stabilization; elimination of SI thoughts; development of comprehensive mental wellness/sobriety plan. Update 08/02/2021: No changes at this time.   Patient Goals: "getting my life back on track and stop using drugs"  Update 08/02/2021: No changes at this time.   Discharge Plan or Barriers: Patient reports that he is interested in [facility] for residential treatment.  Update 08/02/2021: Patient to be admitted into [place] residential substance abuse treatment program upon discharge on 08/04/2021.  Reason for Continuation of Hospitalization: Anxiety Depression Medication stabilization Withdrawal symptoms  Estimated Length of Stay: 1-7 days Update 08/02/2021: Patient to discharge 08/04/2021.      Scribe for Treatment Team: Sherilyn Dacosta 08/02/2021 11:29 AM

## 2021-08-02 NOTE — Progress Notes (Signed)
Pt was social at times in the day room but he did not attend groups "I don't have to go". Pt was calm and cooperative. Torrie Mayers RN

## 2021-08-02 NOTE — Plan of Care (Signed)
  Problem: Education: Goal: Knowledge of [facility] Education information/materials will improve Outcome: Progressing Goal: Mental status will improve Outcome: Progressing Goal: Verbalization of understanding the information provided will improve Outcome: Progressing   Problem: Activity: Goal: Interest or engagement in activities will improve Outcome: Progressing Goal: Sleeping patterns will improve Outcome: Progressing   Problem: Coping: Goal: Ability to demonstrate self-control will improve Outcome: Progressing   Problem: Health Behavior/Discharge Planning: Goal: Identification of resources available to assist in meeting health care needs will improve Outcome: Progressing Goal: Compliance with treatment plan for underlying cause of condition will improve Outcome: Progressing

## 2021-08-02 NOTE — Group Note (Signed)
LCSW Group Therapy Note  Group Date: 08/02/2021 Start Time: 1300 End Time: 1400   Type of Therapy and Topic:  Group Therapy - Healthy vs Unhealthy Coping Skills  Participation Level:  Did Not Attend   Description of Group The focus of this group was to determine what unhealthy coping techniques typically are used by group members and what healthy coping techniques would be helpful in coping with various problems. Patients were guided in becoming aware of the differences between healthy and unhealthy coping techniques. Patients were asked to identify 2-3 healthy coping skills they would like to learn to use more effectively.  Therapeutic Goals Patients learned that coping is what human beings do all day long to deal with various situations in their lives Patients defined and discussed healthy vs unhealthy coping techniques Patients identified their preferred coping techniques and identified whether these were healthy or unhealthy Patients determined 2-3 healthy coping skills they would like to become more familiar with and use more often. Patients provided support and ideas to each other   Summary of Patient Progress:  Patient did not attend group despite encouraged participation.   Therapeutic Modalities Cognitive Behavioral Therapy Motivational Interviewing  Randie Bloodgood K Kaulin Chaves, LCSWA 08/02/2021  3:50 PM   

## 2021-08-02 NOTE — Progress Notes (Signed)
[facility] MD Progress Note  08/02/2021 10:54 AM Steven Khan  MRN:  [id number]  CC "OK"  Subjective:  40 year old male with history of cocaine use disorder, MDD and PTSD presenting with worsening depression and suicidal ideations with plan to overdose. UDS is + for cocaine on admission.   Today, pt reports feeling ok, but did not want to engage much. Steven Khan said that Steven Khan has "drug problems" and wants to go to a rehab place.  Steven Khan denied SI and is not interested in trying an antidepressant.   Steven Khan denies SI/HI/AH/VH. Steven Khan has been accepted to [facility], and program will be picking patient up Monday, October 17th at 10AM.   Principal Problem: Major depressive disorder, recurrent severe without psychotic features (HCC) Diagnosis: Principal Problem:   Major depressive disorder, recurrent severe without psychotic features (HCC) Active Problems:   PTSD (post-traumatic stress disorder)   Cocaine abuse (HCC)   Suicidal ideations  Total Time spent with patient: 20 minutes  Past Psychiatric History: See H&P  Past Medical History: History reviewed. No pertinent past medical history.  Past Surgical History:  Procedure Laterality Date   HERNIA REPAIR     Family History: History reviewed. No pertinent family history. Family Psychiatric  History: See H&P Social History:  Social History   Substance and Sexual Activity  Alcohol Use Yes     Social History   Substance and Sexual Activity  Drug Use Yes   Types: Cocaine, Marijuana   Comment: ecstasy    Social History   Socioeconomic History   Marital status: Divorced    Spouse name: Not on file   Number of children: Not on file   Years of education: Not on file   Highest education level: Not on file  Occupational History   Not on file  Tobacco Use   Smoking status: Every Day    Packs/day: 1.00    Types: Cigarettes   Smokeless tobacco: Never  Vaping Use   Vaping Use: Never used  Substance and Sexual Activity   Alcohol use: Yes   Drug use: Yes     Types: Cocaine, Marijuana    Comment: ecstasy   Sexual activity: Not on file  Other Topics Concern   Not on file  Social History Narrative   Not on file   Social Determinants of Health   Financial Resource Strain: Not on file  Food Insecurity: Not on file  Transportation Needs: Not on file  Physical Activity: Not on file  Stress: Not on file  Social Connections: Not on file   Additional Social History:   Sleep: Fair  Appetite:  Fair  Current Medications: Current Facility-Administered Medications  Medication Dose Route Frequency Provider Last Rate Last Admin   acetaminophen (TYLENOL) tablet 650 mg  650 mg Oral Q6H PRN Charm Rings, NP       alum & mag hydroxide-simeth (MAALOX/MYLANTA) 200-200-20 MG/5ML suspension 30 mL  30 mL Oral Q4H PRN Charm Rings, NP       hydrOXYzine (ATARAX/VISTARIL) tablet 50 mg  50 mg Oral TID PRN Jesse Sans, MD   50 mg at 07/29/21 2104   magnesium hydroxide (MILK OF MAGNESIA) suspension 30 mL  30 mL Oral Daily PRN Charm Rings, NP       neomycin-bacitracin-polymyxin (NEOSPORIN) ointment   Topical BID Jesse Sans, MD   Given at 08/01/21 0819   nicotine (NICODERM CQ - dosed in mg/24 hours) patch 21 mg  21 mg Transdermal Daily Jesse Sans, MD  21 mg at 07/29/21 1136    Lab Results: No results found for this or any previous visit (from the past 48 hour(s)).  Blood Alcohol level:  Lab Results  Component Value Date   ETH <10 07/26/2021   ETH <10 07/07/2020    Metabolic Disorder Labs: No results found for: HGBA1C, MPG No results found for: PROLACTIN Lab Results  Component Value Date   CHOL 206 (H) 06/01/2020   TRIG 70 06/01/2020   HDL 57 06/01/2020   CHOLHDL 3.6 06/01/2020   VLDL 14 06/01/2020   LDLCALC 135 (H) 06/01/2020    Physical Findings: AIMS:  , ,  ,  ,    CIWA:    COWS:     Musculoskeletal: Strength & Muscle Tone: within normal limits Gait & Station: normal Patient leans: N/A  Psychiatric  Specialty Exam:  Presentation  General Appearance: Appropriate for Environment  Eye Contact:Fair  Speech:Normal Rate  Speech Volume:Normal  Handedness:Right   Mood and Affect  Mood:Euthymic  Affect:Congruent   Thought Process  Thought Processes:Coherent; Goal Directed  Descriptions of Associations:Intact  Orientation:Full (Time, Place and Person)  Thought Content:Logical  History of Schizophrenia/Schizoaffective disorder:No  Duration of Psychotic Symptoms:N.A Hallucinations:Denies  Ideas of Reference:None  Suicidal Thoughts:Denies Homicidal Thoughts:Denies   Sensorium  Memory: Fair Judgment:Intact  Insight:Present   Executive Functions  Concentration:Fair  Attention Span:Fair  Recall:Fair  Fund of Knowledge:Fair  Language:Fair   Psychomotor Activity  Psychomotor Activity:Normal   Assets  Assets:Communication Skills; Desire for Improvement; Physical Health; Resilience   Sleep  Sleep:Good, 8.25 hours    Physical Exam: Physical Exam ROS Blood pressure 121/87, pulse (!) 56, temperature 98.2 F (36.8 C), temperature source Oral, resp. rate 17, height 5\' 9"  (1.753 m), weight 97.1 kg, SpO2 100 %. Body mass index is 31.6 kg/m.   Treatment Plan Summary: Daily contact with patient to assess and evaluate symptoms and progress in treatment and Medication management  40 year old male with history of cocaine use disorder, MDD (r/o Substance-induced mood disorder) and PTSD presenting for worsening depression and suicidal ideations with plan to overdose in context of cocaine use. Patient desires residential substance abuse treatment at this time. Steven Khan has been accepted to [facility], and will discharge Monday, October 17th, at 10AM.   Currently, pt is not interested in an antidepressant.    Steven Hendershott, MD 08/02/2021, 10:54 AM

## 2021-08-02 NOTE — Progress Notes (Signed)
Patient has been pleasant and cooperative. Sitting in the dayroom watching TV with peers. Affect is bright. Mood is euthymic. Denies SI, HI and AVH

## 2021-08-02 NOTE — Plan of Care (Signed)
Pt rates depression 5/10, anxiety 10/10. Pt denies SI, HI and AVH. Pt was educated on care plan and verbalizes understanding. Torrie Mayers RN Problem: Education: Goal: Knowledge of [facility] Education information/materials will improve Outcome: Progressing Goal: Mental status will improve Outcome: Progressing Goal: Verbalization of understanding the information provided will improve Outcome: Progressing   Problem: Activity: Goal: Interest or engagement in activities will improve Outcome: Progressing Goal: Sleeping patterns will improve Outcome: Progressing   Problem: Coping: Goal: Ability to demonstrate self-control will improve Outcome: Progressing   Problem: Health Behavior/Discharge Planning: Goal: Identification of resources available to assist in meeting health care needs will improve Outcome: Progressing Goal: Compliance with treatment plan for underlying cause of condition will improve Outcome: Progressing

## 2021-08-03 DIAGNOSIS — F4310 Post-traumatic stress disorder, unspecified: Secondary | ICD-10-CM | POA: Diagnosis not present

## 2021-08-03 DIAGNOSIS — F1410 Cocaine abuse, uncomplicated: Secondary | ICD-10-CM | POA: Diagnosis not present

## 2021-08-03 DIAGNOSIS — F332 Major depressive disorder, recurrent severe without psychotic features: Secondary | ICD-10-CM | POA: Diagnosis not present

## 2021-08-03 MED ORDER — BACITRACIN-NEOMYCIN-POLYMYXIN OINTMENT TUBE
TOPICAL_OINTMENT | CUTANEOUS | Status: DC | PRN
  Filled 2021-08-03: qty 14.17

## 2021-08-03 NOTE — Group Note (Signed)
[facility] LCSW Group Therapy Note   Group Date: 08/03/2021 Start Time: 1310 End Time: 1410   Type of Therapy and Topic: Group Therapy: Avoiding Self-Sabotaging and Enabling Behaviors  Participation Level: Did Not Attend  Mood:  Description of Group:  In this group, patients will learn how to identify obstacles, self-sabotaging and enabling behaviors, as well as: what are they, why do we do them and what needs these behaviors meet. Discuss unhealthy relationships and how to have positive healthy boundaries with those that sabotage and enable. Explore aspects of self-sabotage and enabling in yourself and how to limit these self-destructive behaviors in everyday life.   Therapeutic Goals: 1. Patient will identify one obstacle that relates to self-sabotage and enabling behaviors 2. Patient will identify one personal self-sabotaging or enabling behavior they did prior to admission 3. Patient will state a plan to change the above identified behavior 4. Patient will demonstrate ability to communicate their needs through discussion and/or role play.    Summary of Patient Progress: Patient did not attend group despite encouraged participation.     Therapeutic Modalities:  Cognitive Behavioral Therapy Person-Centered Therapy Motivational Interviewing    Adonus Uselman K Maleeyah Mccaughey, LCSWA 

## 2021-08-03 NOTE — BHH Counselor (Signed)
CSW met with patient 1:1 to discuss patient discharge to [facility] residential treatment program. Patient was informed he will need to have all belongings he wishes to bring with him to treatment on his person upon hospital discharge 08/04/21. Patient stated he had all belongings on the unit. Patient inquired about additional information regarding [facility]. CSW provided a handout with program details to patient. No additional concerns noted.   Idamae Lusher, MSW, [place], [place]  08/03/2021 8:56AM

## 2021-08-03 NOTE — Progress Notes (Signed)
Patient alert and oriented. Patient currently denies SI/HI/AVH. Patient has been pleasant and cooperative. Patient scheduled morning neosporin stating patient already had some still in his room. MD notified and neosporin is now a PRN medication. Patient also refused nicotine patch. MD notified and discontinued the medication.  Patient was isolative to room in the morning. Patient interactive with staff and peers in the day room. Q15 minute safety checks maintained. Patient remains safe on the unit at this time.

## 2021-08-03 NOTE — Progress Notes (Signed)
[facility] Steven Khan Progress Note  08/03/2021 11:59 AM Steven Khan  MRN:  [id number]  Subjective:  40 year old male with history of cocaine use disorder, MDD and PTSD presenting with worsening depression and suicidal ideations with plan to overdose. UDS is + for cocaine on admission.   Today, Steven Khan was seen in his room, reporting feeling ok, and looking forward to go [facility] tomorrow.  Steven Khan is not interested in trying an antidepressant at this time.    Steven Khan denies SI/HI/AH/VH.   SW: Steven Khan has been accepted to [facility], and program will be picking patient up Monday, October 17th at 10AM.   Principal Problem: Major depressive disorder, recurrent severe without psychotic features (HCC) Diagnosis: Principal Problem:   Major depressive disorder, recurrent severe without psychotic features (HCC) Active Problems:   PTSD (post-traumatic stress disorder)   Cocaine abuse (HCC)   Suicidal ideations  Total Time spent with patient: 20 minutes  Past Psychiatric History: See H&P  Past Medical History: History reviewed. No pertinent past medical history.  Past Surgical History:  Procedure Laterality Date   HERNIA REPAIR     Family History: History reviewed. No pertinent family history. Family Psychiatric  History: See H&P Social History:  Social History   Substance and Sexual Activity  Alcohol Use Yes     Social History   Substance and Sexual Activity  Drug Use Yes   Types: Cocaine, Marijuana   Comment: ecstasy    Social History   Socioeconomic History   Marital status: Divorced    Spouse name: Not on file   Number of children: Not on file   Years of education: Not on file   Highest education level: Not on file  Occupational History   Not on file  Tobacco Use   Smoking status: Every Day    Packs/day: 1.00    Types: Cigarettes   Smokeless tobacco: Never  Vaping Use   Vaping Use: Never used  Substance and Sexual Activity   Alcohol use: Yes   Drug use: Yes    Types: Cocaine, Marijuana     Comment: ecstasy   Sexual activity: Not on file  Other Topics Concern   Not on file  Social History Narrative   Not on file   Social Determinants of Health   Financial Resource Strain: Not on file  Food Insecurity: Not on file  Transportation Needs: Not on file  Physical Activity: Not on file  Stress: Not on file  Social Connections: Not on file   Additional Social History:   Sleep: Fair  Appetite:  Fair  Current Medications: Current Facility-Administered Medications  Medication Dose Route Frequency Provider Last Rate Last Admin   acetaminophen (TYLENOL) tablet 650 mg  650 mg Oral Q6H PRN Steven Khan, Steven Khan       alum & mag hydroxide-simeth (MAALOX/MYLANTA) 200-200-20 MG/5ML suspension 30 mL  30 mL Oral Q4H PRN Steven Khan, Steven Khan       hydrOXYzine (ATARAX/VISTARIL) tablet 50 mg  50 mg Oral TID PRN Steven Khan, Steven Khan   50 mg at 07/29/21 2104   magnesium hydroxide (MILK OF MAGNESIA) suspension 30 mL  30 mL Oral Daily PRN Steven Khan, Steven Khan       neomycin-bacitracin-polymyxin (NEOSPORIN) ointment   Topical PRN [name], Steven Khan        Lab Results: No results found for this or any previous visit (from the past 48 hour(s)).  Blood Alcohol level:  Lab Results  Component Value Date   ETH <10  07/26/2021   ETH <10 07/07/2020    Metabolic Disorder Labs: No results found for: HGBA1C, MPG No results found for: PROLACTIN Lab Results  Component Value Date   CHOL 206 (H) 06/01/2020   TRIG 70 06/01/2020   HDL 57 06/01/2020   CHOLHDL 3.6 06/01/2020   VLDL 14 06/01/2020   LDLCALC 135 (H) 06/01/2020    Physical Findings: AIMS:  , ,  ,  ,    CIWA:    COWS:     Musculoskeletal: Strength & Muscle Tone: within normal limits Gait & Station: normal Patient leans: N/A  Psychiatric Specialty Exam:  Presentation  General Appearance: Appropriate for Environment  Eye Contact:Fair  Speech:Normal Rate  Speech Volume:Normal  Handedness:Right   Mood and Affect   Mood:Euthymic  Affect:Congruent   Thought Process  Thought Processes:Coherent; Goal Directed  Descriptions of Associations:Intact  Orientation:Full (Time, Place and Person)  Thought Content:Logical  History of Schizophrenia/Schizoaffective disorder:No  Duration of Psychotic Symptoms:N.A Hallucinations:Denies  Ideas of Reference:None  Suicidal Thoughts:Denies Homicidal Thoughts:Denies   Sensorium  Memory: Fair Judgment:Intact  Insight:Present   Executive Functions  Concentration:Fair  Attention Span:Fair  Recall:Fair  Fund of Knowledge:Fair  Language:Fair   Psychomotor Activity  Psychomotor Activity:Normal   Assets  Assets:Communication Skills; Desire for Improvement; Physical Health; Resilience   Sleep  Sleep:Good, 8.25 hours    Physical Exam: Physical Exam ROS Blood pressure 135/71, pulse 63, temperature 98.1 F (36.7 C), temperature source Oral, resp. rate 18, height 5\' 9"  (1.753 m), weight 97.1 kg, SpO2 100 %. Body mass index is 31.6 kg/m.   Treatment Plan Summary: Daily contact with patient to assess and evaluate symptoms and progress in treatment and Medication management  40 year old male with history of cocaine use disorder, MDD (r/o Substance-induced mood disorder) and PTSD presenting for worsening depression and suicidal ideations with plan to overdose in context of cocaine use. Patient desires residential substance abuse treatment at this time. Steven Khan has been accepted to [facility], and will discharge Monday, October 17th, at 10AM.   Currently, pt is not interested in an antidepressant. Denied SI or Hi.     Steven Khan, Steven Khan 08/03/2021, 11:59 AM

## 2021-08-03 NOTE — Plan of Care (Signed)
  Problem: Education: Goal: Knowledge of [facility] Education information/materials will improve Outcome: Progressing Goal: Mental status will improve Outcome: Progressing Goal: Verbalization of understanding the information provided will improve Outcome: Progressing   Problem: Coping: Goal: Ability to demonstrate self-control will improve Outcome: Progressing   Problem: Health Behavior/Discharge Planning: Goal: Compliance with treatment plan for underlying cause of condition will improve Outcome: Progressing

## 2021-08-04 DIAGNOSIS — F332 Major depressive disorder, recurrent severe without psychotic features: Secondary | ICD-10-CM | POA: Diagnosis not present

## 2021-08-04 NOTE — [facility] Group Notes (Signed)
[facility] Group Notes:  (Nursing/MHT/Case Management/Adjunct)  Date:  08/04/2021  Time:  9:46 AM  Type of Therapy:  Community Meeting  Participation Level:  Did Not Attend   Lynelle Smoke [facility] 08/04/2021, 9:46 AM

## 2021-08-04 NOTE — Progress Notes (Signed)
Patient has remained pleasant and cooperative. Denies SI, HI and AVH

## 2021-08-04 NOTE — Progress Notes (Signed)
Pt was educated on dc plan and verbalizes understanding. Pt denies SI, HI and AVH. Pt received AVS, transition record, money from security ($170) and belongings. Torrie Mayers RN

## 2021-08-04 NOTE — Discharge Summary (Signed)
Physician Discharge Summary Note  Patient:  Steven Khan is an 40 y.o., male MRN:  [id number] DOB:  08-22-1981 Patient phone:  [phone] (home)  Patient address:   [address] [place] [address],  Total Time spent with patient: 35 minutes- 25 minutes face-to-face contact with patient, 10 minutes documentation, coordination of care, scripts   Date of Admission:  07/27/2021 Date of Discharge: 08/04/2021  Reason for Admission:   40 year old male with history of MDD and PTSD presenting with worsening depression and suicidal ideations with plan to overdose.   Principal Problem: Major depressive disorder, recurrent severe without psychotic features [facility]) Discharge Diagnoses: Principal Problem:   Major depressive disorder, recurrent severe without psychotic features (HCC) Active Problems:   PTSD (post-traumatic stress disorder)   Cocaine abuse (HCC)   Past Psychiatric History: of MDD and PTSD. He was treated with medications while in prison. He cannot recall the names of medications, but notes that they were unhelpful and made him feel "loopy." Endorses suicide attempt via overdose. No current outpatient treatment.   Past Medical History: History reviewed. No pertinent past medical history.  Past Surgical History:  Procedure Laterality Date   HERNIA REPAIR     Family History: History reviewed. No pertinent family history. Family Psychiatric  History: None reported Social History:  Social History   Substance and Sexual Activity  Alcohol Use Yes     Social History   Substance and Sexual Activity  Drug Use Yes   Types: Cocaine, Marijuana   Comment: ecstasy    Social History   Socioeconomic History   Marital status: Divorced    Spouse name: Not on file   Number of children: Not on file   Years of education: Not on file   Highest education level: Not on file  Occupational History   Not on file  Tobacco Use   Smoking status: Every Day    Packs/day: 1.00     Types: Cigarettes   Smokeless tobacco: Never  Vaping Use   Vaping Use: Never used  Substance and Sexual Activity   Alcohol use: Yes   Drug use: Yes    Types: Cocaine, Marijuana    Comment: ecstasy   Sexual activity: Not on file  Other Topics Concern   Not on file  Social History Narrative   Not on file   Social Determinants of Health   Financial Resource Strain: Not on file  Food Insecurity: Not on file  Transportation Needs: Not on file  Physical Activity: Not on file  Stress: Not on file  Social Connections: Not on file    Hospital Course:   40 year old male with history of MDD and PTSD presenting with worsening depression and suicidal ideations with plan to overdose. He completed detox without complications. He did not wish to start any medications for MDD or PTSD. He attended groups, and mood gradually improved. He denies SI/HI/AH/VH. He will discharge to [facility] for residential substance abuse treatment.   Physical Findings: AIMS:  , ,  ,  ,    CIWA:    COWS:     Musculoskeletal: Strength & Muscle Tone: within normal limits Gait & Station: normal Patient leans: N/A   Psychiatric Specialty Exam:  Presentation  General Appearance: Appropriate for Environment; Casual  Eye Contact:Good  Speech:Normal Rate; Clear and Coherent  Speech Volume:Normal  Handedness:Right   Mood and Affect  Mood:Euthymic  Affect:Congruent   Thought Process  Thought Processes:Coherent; Goal Directed  Descriptions of Associations:Intact  Orientation:Full (Time, Place and  Person)  Thought Content:Logical  History of Schizophrenia/Schizoaffective disorder:No  Duration of Psychotic Symptoms:No data recorded Hallucinations:Hallucinations: None  Ideas of Reference:None  Suicidal Thoughts:Suicidal Thoughts: No  Homicidal Thoughts:Homicidal Thoughts: No   Sensorium  Memory:Immediate Good; Recent Good; Remote Good  Judgment:Intact  Insight:Present   Executive  Functions  Concentration:Fair  Attention Span:Fair  Recall:Fair  Fund of Knowledge:Fair  Language:Fair   Psychomotor Activity  Psychomotor Activity:Psychomotor Activity: Normal   Assets  Assets:Communication Skills; Desire for Improvement; Physical Health; Social Support; Talents/Skills   Sleep  Sleep:Sleep: Fair    Physical Exam: Physical Exam Vitals and nursing note reviewed.  Constitutional:      Appearance: Normal appearance.  HENT:     Head: Normocephalic and atraumatic.     Right Ear: External ear normal.     Left Ear: External ear normal.     Nose: Nose normal.     Mouth/Throat:     Mouth: Mucous membranes are moist.     Pharynx: Oropharynx is clear.  Eyes:     Extraocular Movements: Extraocular movements intact.     Conjunctiva/sclera: Conjunctivae normal.     Pupils: Pupils are equal, round, and reactive to light.  Cardiovascular:     Rate and Rhythm: Normal rate.     Pulses: Normal pulses.     Heart sounds: Normal heart sounds.  Pulmonary:     Effort: Pulmonary effort is normal.     Breath sounds: Normal breath sounds.  Abdominal:     General: Abdomen is flat.     Palpations: Abdomen is soft.  Musculoskeletal:        General: No swelling. Normal range of motion.     Cervical back: Normal range of motion.  Skin:    General: Skin is warm and dry.  Neurological:     General: No focal deficit present.     Mental Status: He is alert and oriented to person, place, and time.  Psychiatric:        Mood and Affect: Mood normal.        Behavior: Behavior normal.        Thought Content: Thought content normal.        Judgment: Judgment normal.   Review of Systems  Constitutional: Negative.   HENT: Negative.    Eyes: Negative.   Respiratory: Negative.    Cardiovascular: Negative.   Gastrointestinal: Negative.   Genitourinary: Negative.   Musculoskeletal: Negative.   Skin: Negative.   Neurological: Negative.   Endo/Heme/Allergies: Negative.    Psychiatric/Behavioral:  Negative for depression, hallucinations, memory loss, substance abuse and suicidal ideas. The patient is not nervous/anxious and does not have insomnia.   Blood pressure 135/71, pulse (!) 54, temperature 98.1 F (36.7 C), temperature source Oral, resp. rate 18, height 5\' 9"  (1.753 m), weight 97.1 kg, SpO2 99 %. Body mass index is 31.6 kg/m.   Social History   Tobacco Use  Smoking Status Every Day   Packs/day: 1.00   Types: Cigarettes  Smokeless Tobacco Never   Tobacco Cessation:  A prescription for an FDA-approved tobacco cessation medication was offered at discharge and the patient refused   Blood Alcohol level:  Lab Results  Component Value Date   ETH <10 07/26/2021   ETH <10 07/07/2020    Metabolic Disorder Labs:  No results found for: HGBA1C, MPG No results found for: PROLACTIN Lab Results  Component Value Date   CHOL 206 (H) 06/01/2020   TRIG 70 06/01/2020   HDL 57 06/01/2020  CHOLHDL 3.6 06/01/2020   VLDL 14 06/01/2020   LDLCALC 135 (H) 06/01/2020    See Psychiatric Specialty Exam and Suicide Risk Assessment completed by Attending Physician prior to discharge.  Discharge destination:  Other:  [facility]  Is patient on multiple antipsychotic therapies at discharge:  No   Has Patient had three or more failed trials of antipsychotic monotherapy by history:  No  Recommended Plan for Multiple Antipsychotic Therapies: NA  Discharge Instructions     Diet - low sodium heart healthy   Complete by: As directed    Increase activity slowly   Complete by: As directed       Allergies as of 08/04/2021   No Known Allergies      Medication List    You have not been prescribed any medications.      Follow-up recommendations:  Activity:  as tolerated Diet:  low sodium heart healthy  Comments:  No medication prescribed. Patient was discharged to [facility] for continued substance abuse treatment. If suicidal or homicidal ideations occur call  911 or proceed to nearest emergency room.   Signed: Jesse Sans, MD 08/04/2021, 8:21 AM

## 2021-08-04 NOTE — Progress Notes (Signed)
Recreation Therapy Notes  INPATIENT RECREATION TR PLAN  Patient Details Name: KASON BENAK MRN: [id number] DOB: 1980/12/11 Today's Date: 08/04/2021  Rec Therapy Plan Is patient appropriate for Therapeutic Recreation?: Yes Treatment times per week: at least 3 Estimated Length of Stay: 5-7 days TR Treatment/Interventions: Group participation (Comment)  Discharge Criteria Pt will be discharged from therapy if:: Discharged Treatment plan/goals/alternatives discussed and agreed upon by:: Patient/family  Discharge Summary Short term goals set: Patient will engage in groups without prompting or encouragement from LRT x3 group sessions within 5 recreation therapy group sessions Short term goals met: Not met Progress toward goals comments: Groups attended Which groups?: Other (Comment) (Self-care) Reason goals not met: Patient spent most of his time in his room Therapeutic equipment acquired: N/A Reason patient discharged from therapy: Discharge from hospital Pt/family agrees with progress & goals achieved: Yes Date patient discharged from therapy: 08/04/21   Preeya Cleckley 08/04/2021, 11:50 AM

## 2021-08-04 NOTE — Plan of Care (Signed)
  Problem: Group Participation Goal: STG - Patient will engage in groups without prompting or encouragement from LRT x3 group sessions within 5 recreation therapy group sessions Description: STG - Patient will engage in groups without prompting or encouragement from LRT x3 group sessions within 5 recreation therapy group sessions 08/04/2021 1149 by Ernest Haber, LRT Outcome: Not Applicable [id number] by Ernest Haber, LRT Outcome: Not Met (add Reason) Note: Patient spent most of his time in his room.

## 2021-08-04 NOTE — Plan of Care (Signed)
  Problem: Education: Goal: Knowledge of [facility] Education information/materials will improve Outcome: Progressing Goal: Mental status will improve Outcome: Progressing Goal: Verbalization of understanding the information provided will improve Outcome: Progressing   Problem: Activity: Goal: Interest or engagement in activities will improve Outcome: Progressing Goal: Sleeping patterns will improve Outcome: Progressing   Problem: Coping: Goal: Ability to demonstrate self-control will improve Outcome: Progressing   Problem: Health Behavior/Discharge Planning: Goal: Identification of resources available to assist in meeting health care needs will improve Outcome: Progressing Goal: Compliance with treatment plan for underlying cause of condition will improve Outcome: Progressing   

## 2021-08-04 NOTE — Progress Notes (Signed)
Recreation Therapy Notes    Date: 08/04/2021  Time: 9:45 am   Location: Craft room   Behavioral response: Appropriate  Intervention Topic: Self-care   Discussion/Intervention:  Group content today was focused on Self-Care. The group defined self-care and some positive ways they care for themselves. Individuals expressed ways and reasons why they neglected any self-care in the past. Patients described ways to improve self-care in the future. The group explained what could happen if they did not do any self-care activities at all. The group participated in the intervention "self-care assessment" where they had a chance to discover some of their weaknesses and strengths in self- care. Patient came up with a self-care plan to improve themselves in the future.  Clinical Observations/Feedback: Patient came to group and was focused on what peers and staff had to say about self-care. Individual was social with peers and staff while participating in the intervention.  Monna Crean LRT/CTRS         Jacky Dross 08/04/2021 11:48 AM

## 2021-08-04 NOTE — [facility]HH Suicide Risk Assessment (Signed)
[facility] Discharge Suicide Risk Assessment   Principal Problem: Major depressive disorder, recurrent severe without psychotic features [facility]) Discharge Diagnoses: Principal Problem:   Major depressive disorder, recurrent severe without psychotic features (HCC) Active Problems:   PTSD (post-traumatic stress disorder)   Cocaine abuse (HCC)   Total Time spent with patient: 35 minutes- 25 minutes face-to-face contact with patient, 10 minutes documentation, coordination of care, scripts   Musculoskeletal: Strength & Muscle Tone: within normal limits Gait & Station: normal Patient leans: N/A  Psychiatric Specialty Exam  Presentation  General Appearance: Appropriate for Environment; Casual  Eye Contact:Good  Speech:Normal Rate; Clear and Coherent  Speech Volume:Normal  Handedness:Right   Mood and Affect  Mood:Euthymic  Duration of Depression Symptoms: Less than two weeks  Affect:Congruent   Thought Process  Thought Processes:Coherent; Goal Directed  Descriptions of Associations:Intact  Orientation:Full (Time, Place and Person)  Thought Content:Logical  History of Schizophrenia/Schizoaffective disorder:No  Duration of Psychotic Symptoms:No data recorded Hallucinations:Hallucinations: None  Ideas of Reference:None  Suicidal Thoughts:Suicidal Thoughts: No  Homicidal Thoughts:Homicidal Thoughts: No   Sensorium  Memory:Immediate Good; Recent Good; Remote Good  Judgment:Intact  Insight:Present   Executive Functions  Concentration:Fair  Attention Span:Fair  Recall:Fair  Fund of Knowledge:Fair  Language:Fair   Psychomotor Activity  Psychomotor Activity:Psychomotor Activity: Normal   Assets  Assets:Communication Skills; Desire for Improvement; Physical Health; Social Support; Talents/Skills   Sleep  Sleep:Sleep: Fair   Physical Exam: Physical Exam ROS Blood pressure 135/71, pulse (!) 54, temperature 98.1 F (36.7 C), temperature source Oral,  resp. rate 18, height 5\' 9"  (1.753 m), weight 97.1 kg, SpO2 99 %. Body mass index is 31.6 kg/m.  Mental Status Per Nursing Assessment::   On Admission:  Suicidal ideation indicated by patient  Demographic Factors:  Male  Loss Factors: NA  Historical Factors: NA  Risk Reduction Factors:   Sense of responsibility to family, Positive social support, Positive therapeutic relationship, and Positive coping skills or problem solving skills  Continued Clinical Symptoms:  Depression:   Comorbid alcohol abuse/dependence Unstable or Poor Therapeutic Relationship Previous Psychiatric Diagnoses and Treatments  Cognitive Features That Contribute To Risk:  None    Suicide Risk:  Minimal: No identifiable suicidal ideation.  Patients presenting with no risk factors but with morbid ruminations; may be classified as minimal risk based on the severity of the depressive symptoms    Plan Of Care/Follow-up recommendations:  Activity:  as tolerated Diet:  low sodium heart healthy diet  [phone], MD 08/04/2021, 8:19 AM

## 2021-08-04 NOTE — Progress Notes (Signed)
  [facility] Adult Case Management Discharge Plan :  Will you be returning to the same living situation after discharge:  No. At discharge, do you have transportation home?: Yes,  [facility] is providing transportation to their program Do you have the ability to pay for your medications: No.  Release of information consent forms completed and in the chart;  Patient's signature needed at discharge.  Patient to Follow up at:  Follow-up Information     [facility] Follow up.   Why: They will pick you up at [facility] information: [address],  [place], [place] [address]  [phone]                Next level of care provider has access to [facility] Link:no  Safety Planning and Suicide Prevention discussed: Yes,  SPE completed with patient.     Has patient been referred to the Quitline?: Patient refused referral  Patient has been referred for addiction treatment: Pt. refused referral  Harden Mo, LCSW 08/04/2021, 8:55 AM

## 2021-08-04 NOTE — Plan of Care (Addendum)
Pt rates depression, anxiety, hopelessness all at 7/10. Pt denies SI, HI and AVH. Pt was educated on are plan and verbalizes understanding. Torrie Mayers RN Problem: Education: Goal: Knowledge of [facility] Education information/materials will improve Outcome: Adequate for Discharge Goal: Mental status will improve Outcome: Adequate for Discharge Goal: Verbalization of understanding the information provided will improve Outcome: Adequate for Discharge   Problem: Activity: Goal: Interest or engagement in activities will improve Outcome: Adequate for Discharge Goal: Sleeping patterns will improve Outcome: Adequate for Discharge   Problem: Coping: Goal: Ability to demonstrate self-control will improve Outcome: Adequate for Discharge   Problem: Health Behavior/Discharge Planning: Goal: Identification of resources available to assist in meeting health care needs will improve Outcome: Adequate for Discharge Goal: Compliance with treatment plan for underlying cause of condition will improve Outcome: Adequate for Discharge

## 2021-12-29 IMAGING — CR DG ABDOMEN 1V
1 series · 2 of 2 positions shown · non-contrast
Comparison: None.

CLINICAL DATA: Generalized mid abdominal pain.

EXAM:
ABDOMEN - 1 VIEW

[Series 1: dg abd 1 view · 0.14mm/px · 2 of 2 slices shown]
[im 1/2]
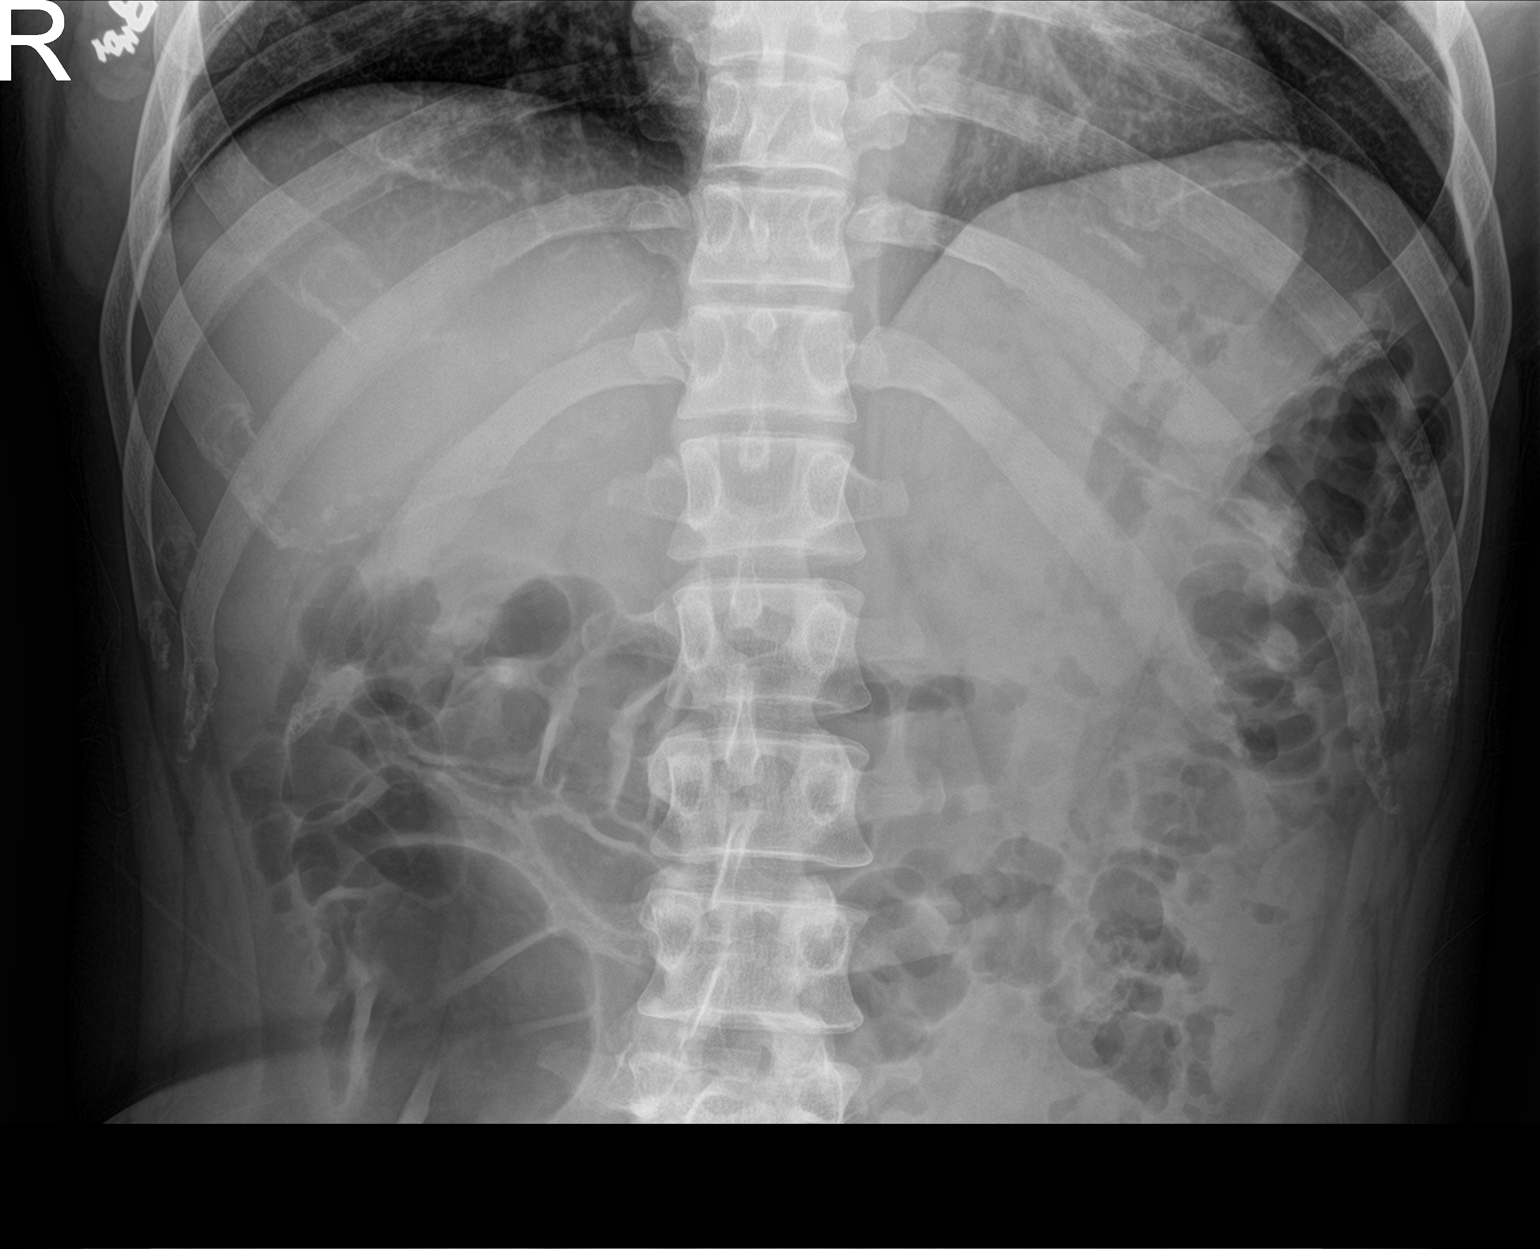
[im 2/2]
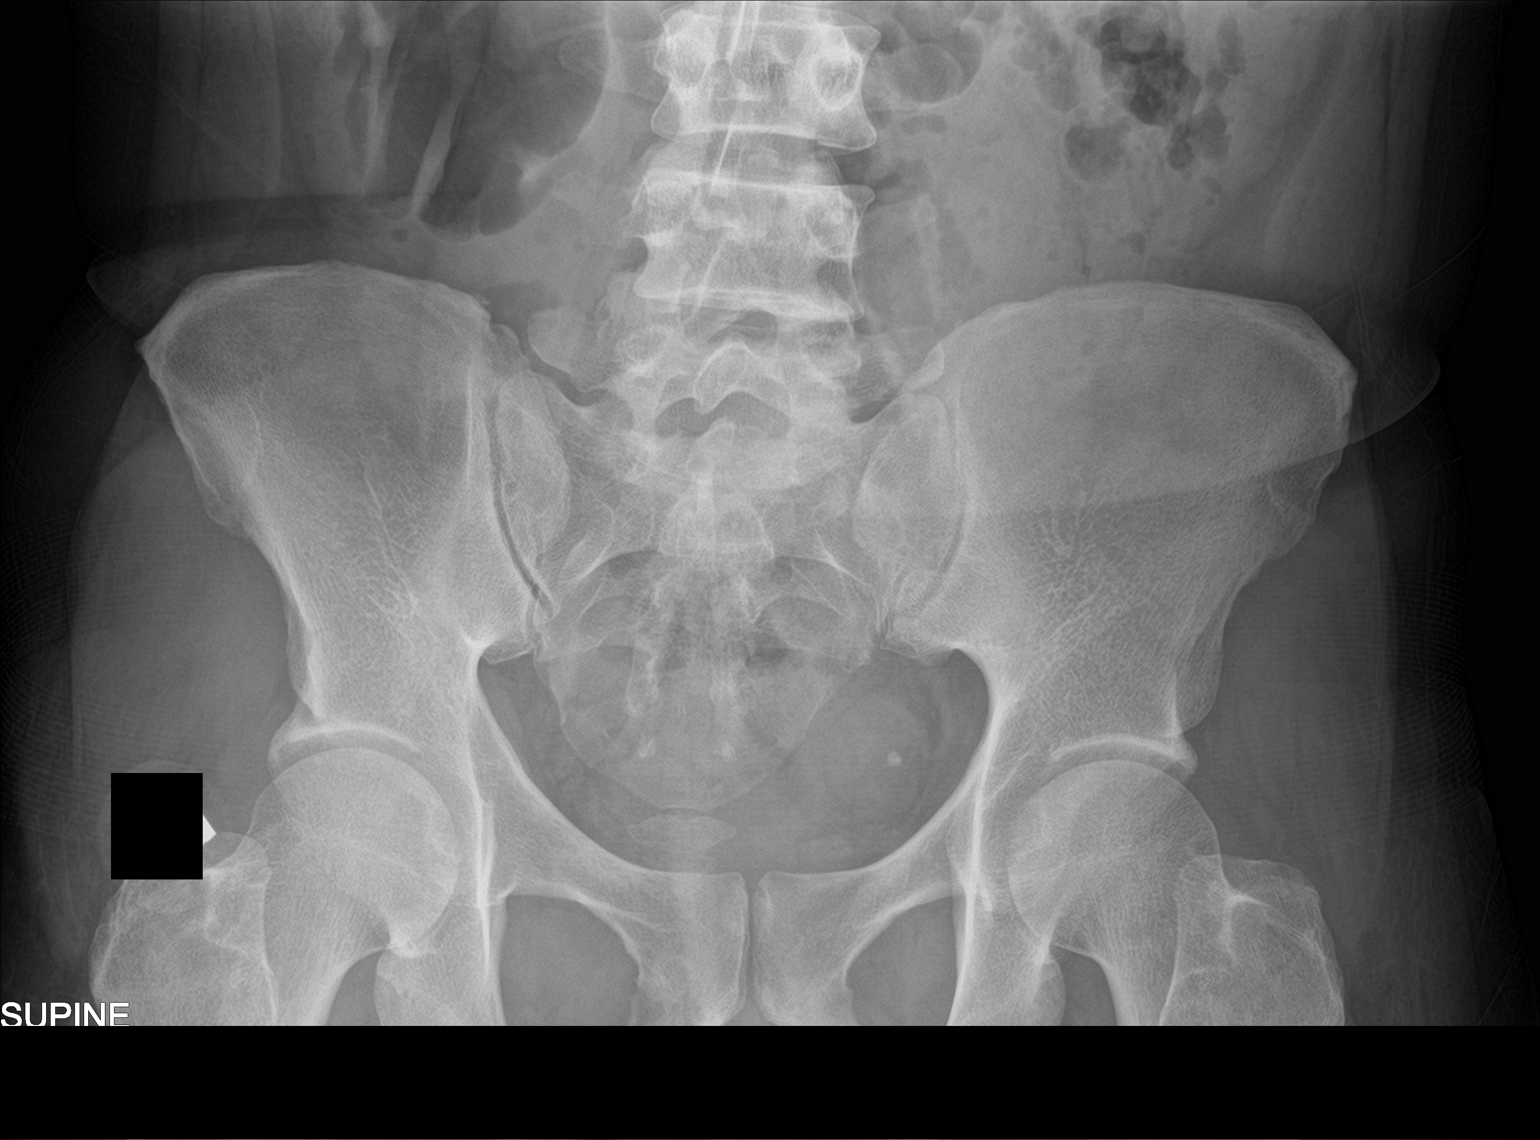

[2 of 2 positions shown; findings below may reference images not displayed]

FINDINGS: Lung bases are clear. Gas is demonstrated within nondilated loops of
large and small bowel in a nonobstructed pattern. Osseous structures
unremarkable. Phleboliths left hemipelvis.
IMPRESSION: Nonobstructed bowel gas pattern.  Nonobstructed bowel gas pattern.

## 2022-05-02 ENCOUNTER — Emergency Department: Payer: Self-pay

## 2022-05-02 ENCOUNTER — Other Ambulatory Visit: Payer: Self-pay

## 2022-05-02 ENCOUNTER — Emergency Department
Admission: EM | Admit: 2022-05-02 | Discharge: 2022-05-03 | Discharge status: Against Medical Advice | Payer: Self-pay | Attending: Emergency Medicine | Admitting: Emergency Medicine

## 2022-05-02 DIAGNOSIS — F4310 Post-traumatic stress disorder, unspecified: Secondary | ICD-10-CM | POA: Diagnosis present

## 2022-05-02 DIAGNOSIS — F1010 Alcohol abuse, uncomplicated: Secondary | ICD-10-CM | POA: Diagnosis present

## 2022-05-02 DIAGNOSIS — R45851 Suicidal ideations: Secondary | ICD-10-CM | POA: Insufficient documentation

## 2022-05-02 DIAGNOSIS — Y900 Blood alcohol level of less than 20 mg/100 ml: Secondary | ICD-10-CM | POA: Insufficient documentation

## 2022-05-02 DIAGNOSIS — F1910 Other psychoactive substance abuse, uncomplicated: Secondary | ICD-10-CM

## 2022-05-02 DIAGNOSIS — F332 Major depressive disorder, recurrent severe without psychotic features: Secondary | ICD-10-CM | POA: Diagnosis present

## 2022-05-02 DIAGNOSIS — F1410 Cocaine abuse, uncomplicated: Secondary | ICD-10-CM | POA: Diagnosis present

## 2022-05-02 DIAGNOSIS — F17210 Nicotine dependence, cigarettes, uncomplicated: Secondary | ICD-10-CM | POA: Insufficient documentation

## 2022-05-02 LAB — ACETAMINOPHEN LEVEL: Acetaminophen (Tylenol), Serum: <10 ug/mL — ABNORMAL LOW (ref 10–30)

## 2022-05-02 LAB — CBC WITH DIFFERENTIAL/PLATELET
Abs Immature Granulocytes: 0.05 10*3/uL (ref 0.00–0.07)
Basophils Absolute: 0 10*3/uL (ref 0.0–0.1)
Basophils Relative: 0 %
Eosinophils Absolute: 0 10*3/uL (ref 0.0–0.5)
Eosinophils Relative: 0 %
HCT: 41.3 % (ref 39.0–52.0)
Hemoglobin: 13.9 g/dL (ref 13.0–17.0)
Immature Granulocytes: 0 %
Lymphocytes Relative: 7 %
Lymphs Abs: 0.8 10*3/uL (ref 0.7–4.0)
MCH: 31.9 pg (ref 26.0–34.0)
MCHC: 33.7 g/dL (ref 30.0–36.0)
MCV: 94.7 fL (ref 80.0–100.0)
Monocytes Absolute: 0.4 10*3/uL (ref 0.1–1.0)
Monocytes Relative: 3 %
Neutro Abs: 10.6 10*3/uL — ABNORMAL HIGH (ref 1.7–7.7)
Neutrophils Relative %: 90 %
Platelets: 320 10*3/uL (ref 150–400)
RBC: 4.36 MIL/uL (ref 4.22–5.81)
RDW: 14.5 % (ref 11.5–15.5)
WBC: 11.9 10*3/uL — ABNORMAL HIGH (ref 4.0–10.5)
nRBC: 0 % (ref 0.0–0.2)

## 2022-05-02 LAB — COMPREHENSIVE METABOLIC PANEL
ALT: 16 U/L (ref 0–44)
AST: 34 U/L (ref 15–41)
Albumin: 4.6 g/dL (ref 3.5–5.0)
Alkaline Phosphatase: 57 U/L (ref 38–126)
Anion gap: 10 (ref 5–15)
BUN: 20 mg/dL (ref 6–20)
CO2: 20 mmol/L — ABNORMAL LOW (ref 22–32)
Calcium: 8.9 mg/dL (ref 8.9–10.3)
Chloride: 107 mmol/L (ref 98–111)
Creatinine, Ser: 1.35 mg/dL — ABNORMAL HIGH (ref 0.61–1.24)
GFR, Estimated: >60 mL/min (ref >60)
Glucose, Bld: 168 mg/dL — ABNORMAL HIGH (ref 70–99)
Potassium: 3.9 mmol/L (ref 3.5–5.1)
Sodium: 137 mmol/L (ref 135–145)
Total Bilirubin: 1.1 mg/dL (ref 0.3–1.2)
Total Protein: 7.6 g/dL (ref 6.5–8.1)

## 2022-05-02 LAB — LIPASE, BLOOD: Lipase: 23 U/L (ref 11–51)

## 2022-05-02 LAB — ETHANOL: Alcohol, Ethyl (B): <10 mg/dL (ref <10)

## 2022-05-02 LAB — TROPONIN I (HIGH SENSITIVITY)
Troponin I (High Sensitivity): 6 ng/L (ref <18)
Troponin I (High Sensitivity): 12 ng/L (ref <18)

## 2022-05-02 LAB — SALICYLATE LEVEL: Salicylate Lvl: <7 mg/dL — ABNORMAL LOW (ref 7.0–30.0)

## 2022-05-02 MED ORDER — FAMOTIDINE 20 MG PO TABS
40.0000 mg | ORAL_TABLET | Freq: Once | ORAL | Status: AC
  Administered 2022-05-02: 40 mg via ORAL
  Filled 2022-05-02: qty 2

## 2022-05-02 MED ORDER — ALUM & MAG HYDROXIDE-SIMETH 200-200-20 MG/5ML PO SUSP
30.0000 mL | Freq: Once | ORAL | Status: AC
  Administered 2022-05-02: 30 mL via ORAL
  Filled 2022-05-02: qty 30

## 2022-05-02 MED ORDER — ALUM & MAG HYDROXIDE-SIMETH 200-200-20 MG/5ML PO SUSP: 30.0000 mL | Freq: Four times a day (QID) | ORAL | Status: DC | PRN

## 2022-05-02 MED ORDER — METOCLOPRAMIDE HCL 10 MG PO TABS
10.0000 mg | ORAL_TABLET | Freq: Once | ORAL | Status: AC
  Administered 2022-05-02: 10 mg via ORAL
  Filled 2022-05-02: qty 1

## 2022-05-02 MED ORDER — LACTATED RINGERS IV BOLUS
1000.0000 mL | Freq: Once | INTRAVENOUS | Status: AC
  Administered 2022-05-02: 1000 mL via INTRAVENOUS

## 2022-05-02 NOTE — ED Provider Notes (Addendum)
[facility] Provider Note    Event Date/Time   First MD Initiated Contact with Patient 05/02/22 1715     (approximate)   History   Chief Complaint: Chest Pain   HPI  Steven Khan is a 41 y.o. male with a history of cocaine abuse, alcohol abuse, depression who comes ED by EMS after being found laying in a ditch on the side of the road.  Patient complains of central chest pain which he indicates this is epigastrium.  No shortness of breath.  Patient reports that he ended up in the edition because you thought he was being chased by a car after using cocaine.  Chest pain/epigastric pain feels like burning.  Feels like GERD.     Physical Exam   Triage Vital Signs: ED Triage Vitals  Enc Vitals Group     BP 05/02/22 1715 121/62     Pulse Rate 05/02/22 1715 (!) 125     Resp 05/02/22 1720 (!) 23     Temp 05/02/22 1726 98 F (36.7 C)     Temp Source 05/02/22 1726 Oral     SpO2 05/02/22 1715 93 %     Weight --      Height --      Head Circumference --      Peak Flow --      Pain Score 05/02/22 1725 8     Pain Loc --      Pain Edu? --      Excl. in GC? --     Most recent vital signs: Vitals:   05/02/22 1830 05/02/22 1900  BP: (!) 109/52 118/67  Pulse: (!) 103 86  Resp:    Temp:    SpO2: 99% 96%    General: Awake, no distress.  CV:  Good peripheral perfusion.  Tachycardia heart rate 120 Resp:  Normal effort.  Clear to auscultation bilaterally Abd:  No distention.  Soft with epigastric and left upper quadrant tenderness Other:  Dry mucous membranes   ED Results / Procedures / Treatments   Labs (all labs ordered are listed, but only abnormal results are displayed) Labs Reviewed  CBC WITH DIFFERENTIAL/PLATELET - Abnormal; Notable for the following components:      Result Value   WBC 11.9 (*)    Neutro Abs 10.6 (*)    All other components within normal limits  COMPREHENSIVE METABOLIC PANEL - Abnormal; Notable for the following  components:   CO2 20 (*)    Glucose, Bld 168 (*)    Creatinine, Ser 1.35 (*)    All other components within normal limits  ACETAMINOPHEN LEVEL - Abnormal; Notable for the following components:   Acetaminophen (Tylenol), Serum <10 (*)    All other components within normal limits  SALICYLATE LEVEL - Abnormal; Notable for the following components:   Salicylate Lvl <7.0 (*)    All other components within normal limits  LIPASE, BLOOD  ETHANOL  URINE DRUG SCREEN, QUALITATIVE ([facility] ONLY)  TROPONIN I (HIGH SENSITIVITY)  TROPONIN I (HIGH SENSITIVITY)     EKG Interpreted by me Sinus tachycardia rate 123.  Left axis, normal intervals.  Normal QRS ST segments and T waves.  No acute ischemic changes   RADIOLOGY Chest x-ray interpreted by me, appears normal.  Radiology report reviewed   PROCEDURES:  Procedures   MEDICATIONS ORDERED IN ED: Medications  lactated ringers bolus 1,000 mL (0 mLs Intravenous Stopped 05/02/22 1842)  alum & mag hydroxide-simeth (MAALOX/MYLANTA) 200-200-20 MG/5ML suspension 30  mL (30 mLs Oral Given 05/02/22 1751)  famotidine (PEPCID) tablet 40 mg (40 mg Oral Given 05/02/22 1751)  metoCLOPramide (REGLAN) tablet 10 mg (10 mg Oral Given 05/02/22 1751)     IMPRESSION / MDM / ASSESSMENT AND PLAN / ED COURSE  I reviewed the triage vital signs and the nursing notes.                              Differential diagnosis includes, but is not limited to, dehydration, GERD, alcoholic gastritis, substance induced delirium, electrolyte abnormality, AKI, non-STEMI, pneumothorax  Patient's presentation is most consistent with acute presentation with potential threat to life or bodily function.  Patient presents with epigastric pain most likely due to gastritis/GERD.  We will give antacids.  Also appears dehydrated.  Will give IV fluids.  Due to cocaine use we will check serial troponins.  ----------------------------------------- 7:11 PM on  05/02/2022 ----------------------------------------- Labs all unremarkable.  Patient now reports that he is having suicidal ideation.  Review of outside records shows that in October 2022 patient had overnight hospitalization in psychiatry service due to suicidal ideation associated with his polysubstance abuse.  Not an imminent safety risk, not committable right now.  After a second troponin, if there is no significant change he will be medically clear, will request psychiatry evaluation.   ----------------------------------------- 8:54 PM on 05/02/2022 ----------------------------------------- Repeat troponin normal.  Medically stable to proceed with behavioral medicine evaluation.  The patient has been placed in psychiatric observation due to the need to provide a safe environment for the patient while obtaining psychiatric consultation and evaluation, as well as ongoing medical and medication management to treat the patient's condition.  The patient has not been placed under full IVC at this time.       FINAL CLINICAL IMPRESSION(S) / ED DIAGNOSES   Final diagnoses:  Polysubstance abuse (HCC)  Suicidal ideation     Rx / DC Orders   ED Discharge Orders     None        Note:  This document was prepared using Dragon voice recognition software and may include unintentional dictation errors.   Sharman Cheek, MD 05/02/22 Darnell Level    Sharman Cheek, MD 05/02/22 [phone]
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
## 2022-05-02 NOTE — ED Notes (Signed)
TTS counselor in pt's room at this time

## 2022-05-02 NOTE — ED Notes (Signed)
Patient is dressed out
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
## 2022-05-02 NOTE — Consult Note (Incomplete)
[place] [facility] Consult   Reason for Consult:  *** Referring Physician:  *** Patient Identification: Steven Khan MRN:  [id number] Principal Diagnosis: <principal problem not specified> Diagnosis:  Active Problems:   * No active hospital problems. *   Total Time spent with patient: {Time; 15 min - 8 hours:17441}  Subjective:   Steven Khan is a 41 y.o. male patient admitted with ***.  HPI:  ***  Past Psychiatric History: ***  Risk to Self:   Risk to Others:   Prior Inpatient Therapy:   Prior Outpatient Therapy:    Past Medical History: History reviewed. No pertinent past medical history.  Past Surgical History:  Procedure Laterality Date  . HERNIA REPAIR     Family History: History reviewed. No pertinent family history. Family Psychiatric  History: *** Social History:  Social History   Substance and Sexual Activity  Alcohol Use Yes     Social History   Substance and Sexual Activity  Drug Use Yes  . Types: Cocaine, Marijuana   Comment: ecstasy    Social History   Socioeconomic History  . Marital status: Divorced    Spouse name: Not on file  . Number of children: Not on file  . Years of education: Not on file  . Highest education level: Not on file  Occupational History  . Not on file  Tobacco Use  . Smoking status: Every Day    Packs/day: 1.00    Types: Cigarettes  . Smokeless tobacco: Never  Vaping Use  . Vaping Use: Never used  Substance and Sexual Activity  . Alcohol use: Yes  . Drug use: Yes    Types: Cocaine, Marijuana    Comment: ecstasy  . Sexual activity: Not on file  Other Topics Concern  . Not on file  Social History Narrative  . Not on file   Social Determinants of Health   Financial Resource Strain: Not on file  Food Insecurity: Not on file  Transportation Needs: Not on file  Physical Activity: Not on file  Stress: Not on file  Social Connections: Not on file   Additional Social History:    Allergies:   No Known Allergies  Labs:  Results for orders placed or performed during the hospital encounter of 05/02/22 (from the past 48 hour(s))  CBC with Differential     Status: Abnormal   Collection Time: 05/02/22  5:32 PM  Result Value Ref Range   WBC 11.9 (H) 4.0 - 10.5 K/uL   RBC 4.36 4.22 - 5.81 MIL/uL   Hemoglobin 13.9 13.0 - 17.0 g/dL   HCT 41.3 39.0 - 52.0 %   MCV 94.7 80.0 - 100.0 fL   MCH 31.9 26.0 - 34.0 pg   MCHC 33.7 30.0 - 36.0 g/dL   RDW 14.5 11.5 - 15.5 %   Platelets 320 150 - 400 K/uL   nRBC 0.0 0.0 - 0.2 %   Neutrophils Relative % 90 %   Neutro Abs 10.6 (H) 1.7 - 7.7 K/uL   Lymphocytes Relative 7 %   Lymphs Abs 0.8 0.7 - 4.0 K/uL   Monocytes Relative 3 %   Monocytes Absolute 0.4 0.1 - 1.0 K/uL   Eosinophils Relative 0 %   Eosinophils Absolute 0.0 0.0 - 0.5 K/uL   Basophils Relative 0 %   Basophils Absolute 0.0 0.0 - 0.1 K/uL   Immature Granulocytes 0 %   Abs Immature Granulocytes 0.05 0.00 - 0.07 K/uL    Comment: Performed at [facility], [address] [place], [place] [address]  Troponin I (High Sensitivity)     Status: None   Collection Time: 05/02/22  5:32 PM  Result Value Ref Range   Troponin I (High Sensitivity) 6 <18 ng/L    Comment: (NOTE) Elevated high sensitivity troponin I (hsTnI) values and significant  changes across serial measurements may suggest ACS but many other  chronic and acute conditions are known to elevate hsTnI results.  Refer to the "Links" section for chest pain algorithms and additional  guidance. Performed at [facility], [address] [place], [place] [address]   Lipase, blood     Status: None   Collection Time: 05/02/22  5:32 PM  Result Value Ref Range   Lipase 23 11 - 51 U/L    Comment: Performed at [facility], [address] [place], [place] [address]  Comprehensive metabolic panel     Status: Abnormal   Collection Time: 05/02/22  5:32 PM  Result Value Ref Range   Sodium 137 135 - 145  mmol/L   Potassium 3.9 3.5 - 5.1 mmol/L   Chloride 107 98 - 111 mmol/L   CO2 20 (L) 22 - 32 mmol/L   Glucose, Bld 168 (H) 70 - 99 mg/dL    Comment: Glucose reference range applies only to samples taken after fasting for at least 8 hours.   BUN 20 6 - 20 mg/dL   Creatinine, Ser 3.14 (H) 0.61 - 1.24 mg/dL   Calcium 8.9 8.9 - 97.0 mg/dL   Total Protein 7.6 6.5 - 8.1 g/dL   Albumin 4.6 3.5 - 5.0 g/dL   AST 34 15 - 41 U/L   ALT 16 0 - 44 U/L   Alkaline Phosphatase 57 38 - 126 U/L   Total Bilirubin 1.1 0.3 - 1.2 mg/dL   GFR, Estimated >26 >37 mL/min    Comment: (NOTE) Calculated using the CKD-EPI Creatinine Equation (2021)    Anion gap 10 5 - 15    Comment: Performed at [facility], [address] [place], [place] [address]  Ethanol     Status: None   Collection Time: 05/02/22  5:32 PM  Result Value Ref Range   Alcohol, Ethyl (B) <10 <10 mg/dL    Comment: (NOTE) Lowest detectable limit for serum alcohol is 10 mg/dL.  For medical purposes only. Performed at [facility], [address], [place], [place] [address]   Acetaminophen level     Status: Abnormal   Collection Time: 05/02/22  6:42 PM  Result Value Ref Range   Acetaminophen (Tylenol), Serum <10 (L) 10 - 30 ug/mL    Comment: (NOTE) Therapeutic concentrations vary significantly. A range of 10-30 ug/mL  may be an effective concentration for many patients. However, some  are best treated at concentrations outside of this range. Acetaminophen concentrations >150 ug/mL at 4 hours after ingestion  and >50 ug/mL at 12 hours after ingestion are often associated with  toxic reactions.  Performed at [facility], [address] [place], [place] [address]   Salicylate level     Status: Abnormal   Collection Time: 05/02/22  6:42 PM  Result Value Ref Range   Salicylate Lvl <7.0 (L) 7.0 - 30.0 mg/dL    Comment: Performed at [facility], [address] [place], [place] [address]   Troponin I (High Sensitivity)     Status: None   Collection Time: 05/02/22  7:38 PM  Result Value Ref Range  Troponin I (High Sensitivity) 12 <18 ng/L    Comment: (NOTE) Elevated high sensitivity troponin I (hsTnI) values and significant  changes across serial measurements may suggest ACS but many other  chronic and acute conditions are known to elevate hsTnI results.  Refer to the "Links" section for chest pain algorithms and additional  guidance. Performed at [facility], [address] [place], [place] [address]     Current Facility-Administered Medications  Medication Dose Route Frequency Provider Last Rate Last Admin  . alum & mag hydroxide-simeth (MAALOX/MYLANTA) 200-200-20 MG/5ML suspension 30 mL  30 mL Oral Q6H PRN Sharman Cheek, MD       No current outpatient medications on file.    Musculoskeletal: Strength & Muscle Tone: {desc; muscle tone:32375} Gait & Station: {PE GAIT ED UEAV:40981} Patient leans: {Patient Leans:21022755}            Psychiatric Specialty Exam:  Presentation  General Appearance: Appropriate for Environment  Eye Contact:Fair; Minimal  Speech:Slow; Clear and Coherent  Speech Volume:Normal  Handedness:Right   Mood and Affect  Mood:Depressed; Hopeless; Worthless  Affect:Congruent; Depressed; Flat   Thought Process  Thought Processes:Coherent  Descriptions of Associations:Intact  Orientation:Full (Time, Place and Person)  Thought Content:Logical; WDL  History of Schizophrenia/Schizoaffective disorder:No  Duration of Psychotic Symptoms:N/A  Hallucinations:Hallucinations: None  Ideas of Reference:None  Suicidal Thoughts:Suicidal Thoughts: Yes, Active  Homicidal Thoughts:Homicidal Thoughts: No   Sensorium  Memory:Recent Fair; Immediate Fair  Judgment:Fair  Insight:Fair   Executive Functions  Concentration:Fair  Attention Span:Poor  Recall:Poor  Fund of  Knowledge:Poor  Language:Poor   Psychomotor Activity  Psychomotor Activity:Psychomotor Activity: Normal   Assets  Assets:Communication Skills; Desire for Improvement; Physical Health; Social Support; Talents/Skills   Sleep  Sleep:Sleep: Fair   Physical Exam: Physical Exam Vitals and nursing note reviewed.  HENT:     Head: Normocephalic and atraumatic.     Nose: Nose normal.  Eyes:     Pupils: Pupils are equal, round, and reactive to light.  Pulmonary:     Effort: Pulmonary effort is normal.  Musculoskeletal:        General: Normal range of motion.     Cervical back: Normal range of motion.  Skin:    General: Skin is warm and dry.  Neurological:     Mental Status: He is alert and oriented to person, place, and time.  Psychiatric:        Attention and Perception: Attention and perception normal.        Mood and Affect: Mood is anxious and depressed.    ROS Blood pressure 118/67, pulse 64, temperature 98 F (36.7 C), temperature source Oral, resp. rate 16, SpO2 94 %. There is no height or weight on file to calculate BMI.  Treatment Plan Summary: {CHL [facility] MD TX XBJY:[id number]}  Disposition: {CHL [facility] Consult YQMV:78469}  Jearld Lesch, NP 05/02/2022 11:58 PM

## 2022-05-02 NOTE — ED Notes (Signed)
Pt disclosing to this RN that he was not fully honest during triage. Pt reports he has been having SI and today was an attempt to harm himself. MD made aware.

## 2022-05-02 NOTE — ED Notes (Signed)
Pt is moved to a psych hold, but is to remain in present room until space opens for him.

## 2022-05-02 NOTE — ED Notes (Signed)
Black and gray shirt Blue jean shorts Black belt Black socks Green slides [employer]

## 2022-05-02 NOTE — ED Notes (Signed)
Psych NP speaking with patient 

## 2022-05-02 NOTE — ED Notes (Signed)
Pt up to use urinal . Curtain closed for privacy. Upon finishing, pt opened curtain, got back on stretcher, and pulled up side rail

## 2022-05-03 NOTE — ED Notes (Signed)
VOL/pending inpatient psych admission when medically cleared 

## 2022-05-03 NOTE — ED Notes (Signed)
Pt given breakfast tray

## 2022-05-03 NOTE — ED Notes (Signed)
Pt asked what was the delay in finding him placement. RN informed him that calls were in to several places but it was a waiting game and he had to try to be patient. Pt asked was he free to leave if he chose not to wait. RN assessed chart and he is voluntary. MD made aware and advised it was ok for DC. Pt got his belongings and left.

## 2022-05-03 NOTE — ED Notes (Signed)
Referral information for Psychiatric Hospitalization faxed to;   [facility] [phone])  Steven Khan [phone]),   Steven Khan [phone], [phone], [phone] or [phone]),   Steven Khan [phone] or [phone])  [address] [phone]),   [facility] [phone] -or- [phone]),   [facility] [phone])

## 2022-05-03 NOTE — ED Notes (Signed)
Pt taking shower, provided with change of scrubs 

## 2022-05-03 NOTE — ED Notes (Signed)
Signature pad not working. Verbal consent for DC. Pt called his mom to come pick him up.

## 2022-05-03 NOTE — ED Notes (Signed)
Pt complaint of abdominal pain. Pt advised las BM was 2 days ago. Will make MD aware.

## 2022-05-03 NOTE — ED Provider Notes (Signed)
Procedures     ----------------------------------------- 4:33 PM on 05/03/2022 -----------------------------------------  Patient reports he is no longer interested in waiting for placement for inpatient psychiatric care.  He denies SI or HI, no hallucinations currently.  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  He is here voluntarily.  He is calling his mother to pick him up.  He has medical decision-making capacity.  Will discharge AGAINST MEDICAL ADVICE.    Sharman Cheek, MD 05/03/22 [phone]

## 2022-05-03 NOTE — ED Notes (Signed)
Pt out of shower back in chair

## 2022-05-03 NOTE — ED Provider Notes (Signed)
Emergency Medicine Observation Re-evaluation Note  Steven Khan is a 41 y.o. male, seen on rounds today.  Pt initially presented to the ED for complaints of Chest Pain, Suicidal (Plan to overdose), and Addiction Problem (Daily use of marijuana) Currently, the patient is resting, voices no medical complaints.  Physical Exam  BP 118/67   Pulse 64   Temp 98 F (36.7 C) (Oral)   Resp 16   SpO2 94%  Physical Exam General: Resting in no acute distress Cardiac: No cyanosis Lungs: Equal rise and fall Psych: Not agitated  ED Course / MDM  EKG:   I have reviewed the labs performed to date as well as medications administered while in observation.  Recent changes in the last 24 hours include no events overnight.  Plan  Current plan is for psychiatric disposition.  TALIB HEADLEY is not under involuntary commitment.     Irean Hong, MD 05/03/22 0530

## 2022-06-19 ENCOUNTER — Other Ambulatory Visit: Payer: Self-pay

## 2022-06-19 DIAGNOSIS — R1084 Generalized abdominal pain: Secondary | ICD-10-CM | POA: Insufficient documentation

## 2022-06-19 DIAGNOSIS — R112 Nausea with vomiting, unspecified: Secondary | ICD-10-CM | POA: Diagnosis not present

## 2022-06-19 MED ORDER — ONDANSETRON 4 MG PO TBDP
ORAL_TABLET | ORAL | Status: AC
  Filled 2022-06-19: qty 1

## 2022-06-19 MED ORDER — ONDANSETRON 4 MG PO TBDP
4.0000 mg | ORAL_TABLET | Freq: Once | ORAL | Status: AC | PRN
  Administered 2022-06-19: 4 mg via ORAL

## 2022-06-19 NOTE — ED Triage Notes (Signed)
First nurse note Pt from home via [facility] with c/o N/V x 2 days with abd pain.

## 2022-06-19 NOTE — ED Triage Notes (Signed)
Pt with periumbilicar pain with nausea and vomiting for two days. Pt denies diarrhea or known fever. Pt appears in no acute distress.

## 2022-06-20 ENCOUNTER — Emergency Department: Payer: No Typology Code available for payment source

## 2022-06-20 ENCOUNTER — Emergency Department
Admission: EM | Admit: 2022-06-20 | Discharge: 2022-06-20 | Disposition: A | Discharge status: Home / Self Care | Payer: No Typology Code available for payment source | Attending: Emergency Medicine | Admitting: Emergency Medicine

## 2022-06-20 DIAGNOSIS — R1084 Generalized abdominal pain: Secondary | ICD-10-CM

## 2022-06-20 LAB — URINALYSIS, ROUTINE W REFLEX MICROSCOPIC
Bacteria, UA: NONE SEEN
Bilirubin Urine: NEGATIVE
Glucose, UA: NEGATIVE mg/dL
Hgb urine dipstick: NEGATIVE
Ketones, ur: 80 mg/dL — AB
Leukocytes,Ua: NEGATIVE
Nitrite: NEGATIVE
Protein, ur: 30 mg/dL — AB
Specific Gravity, Urine: >1.046 — ABNORMAL HIGH (ref 1.005–1.030)
Squamous Epithelial / HPF: NONE SEEN (ref 0–5)
pH: 5 (ref 5.0–8.0)

## 2022-06-20 LAB — CBC
HCT: 48.5 % (ref 39.0–52.0)
Hemoglobin: 16.4 g/dL (ref 13.0–17.0)
MCH: 32.5 pg (ref 26.0–34.0)
MCHC: 33.8 g/dL (ref 30.0–36.0)
MCV: 96.2 fL (ref 80.0–100.0)
Platelets: 340 10*3/uL (ref 150–400)
RBC: 5.04 MIL/uL (ref 4.22–5.81)
RDW: 14.2 % (ref 11.5–15.5)
WBC: 10.5 10*3/uL (ref 4.0–10.5)
nRBC: 0 % (ref 0.0–0.2)

## 2022-06-20 LAB — HEPATIC FUNCTION PANEL
ALT: 21 U/L (ref 0–44)
AST: 25 U/L (ref 15–41)
Albumin: 4.6 g/dL (ref 3.5–5.0)
Alkaline Phosphatase: 54 U/L (ref 38–126)
Bilirubin, Direct: 0.3 mg/dL — ABNORMAL HIGH (ref 0.0–0.2)
Indirect Bilirubin: 2.5 mg/dL — ABNORMAL HIGH (ref 0.3–0.9)
Total Bilirubin: 2.8 mg/dL — ABNORMAL HIGH (ref 0.3–1.2)
Total Protein: 7.2 g/dL (ref 6.5–8.1)

## 2022-06-20 LAB — COMPREHENSIVE METABOLIC PANEL
ALT: 19 U/L (ref 0–44)
AST: 24 U/L (ref 15–41)
Albumin: 4.4 g/dL (ref 3.5–5.0)
Alkaline Phosphatase: 53 U/L (ref 38–126)
Anion gap: 12 (ref 5–15)
BUN: 19 mg/dL (ref 6–20)
CO2: 25 mmol/L (ref 22–32)
Calcium: 8.9 mg/dL (ref 8.9–10.3)
Chloride: 103 mmol/L (ref 98–111)
Creatinine, Ser: 1.15 mg/dL (ref 0.61–1.24)
GFR, Estimated: >60 mL/min (ref >60)
Glucose, Bld: 88 mg/dL (ref 70–99)
Potassium: 4 mmol/L (ref 3.5–5.1)
Sodium: 140 mmol/L (ref 135–145)
Total Bilirubin: 2.6 mg/dL — ABNORMAL HIGH (ref 0.3–1.2)
Total Protein: 7.4 g/dL (ref 6.5–8.1)

## 2022-06-20 LAB — URINE DRUG SCREEN, QUALITATIVE (ARMC ONLY)
Amphetamines, Ur Screen: NOT DETECTED
Barbiturates, Ur Screen: NOT DETECTED
Benzodiazepine, Ur Scrn: NOT DETECTED
Cannabinoid 50 Ng, Ur ~~LOC~~: POSITIVE — AB
Cocaine Metabolite,Ur ~~LOC~~: NOT DETECTED
MDMA (Ecstasy)Ur Screen: NOT DETECTED
Methadone Scn, Ur: NOT DETECTED
Opiate, Ur Screen: POSITIVE — AB
Phencyclidine (PCP) Ur S: NOT DETECTED
Tricyclic, Ur Screen: NOT DETECTED

## 2022-06-20 LAB — LIPASE, BLOOD: Lipase: 24 U/L (ref 11–51)

## 2022-06-20 MED ORDER — FAMOTIDINE 20 MG PO TABS: 20.0000 mg | ORAL_TABLET | Freq: Two times a day (BID) | ORAL | 0 refills | Status: AC

## 2022-06-20 MED ORDER — HYDROMORPHONE HCL 1 MG/ML IJ SOLN
1.0000 mg | Freq: Once | INTRAMUSCULAR | Status: AC
  Administered 2022-06-20: 1 mg via INTRAVENOUS
  Filled 2022-06-20: qty 1

## 2022-06-20 MED ORDER — ONDANSETRON 4 MG PO TBDP
4.0000 mg | ORAL_TABLET | Freq: Four times a day (QID) | ORAL | 0 refills | Status: DC | PRN
  Filled 2022-06-20: qty 20, 5d supply, fill #0

## 2022-06-20 MED ORDER — ONDANSETRON HCL 4 MG/2ML IJ SOLN
4.0000 mg | Freq: Once | INTRAMUSCULAR | Status: AC
  Administered 2022-06-20: 4 mg via INTRAVENOUS
  Filled 2022-06-20: qty 2

## 2022-06-20 MED ORDER — IOHEXOL 300 MG/ML  SOLN
100.0000 mL | Freq: Once | INTRAMUSCULAR | Status: AC | PRN
  Administered 2022-06-20: 100 mL via INTRAVENOUS

## 2022-06-20 MED ORDER — ONDANSETRON 4 MG PO TBDP: 4.0000 mg | ORAL_TABLET | Freq: Four times a day (QID) | ORAL | 0 refills | Status: AC | PRN

## 2022-06-20 MED ORDER — DICYCLOMINE HCL 20 MG PO TABS: 20.0000 mg | ORAL_TABLET | Freq: Three times a day (TID) | ORAL | 0 refills | Status: AC | PRN

## 2022-06-20 MED ORDER — SODIUM CHLORIDE 0.9 % IV BOLUS (SEPSIS)
1000.0000 mL | Freq: Once | INTRAVENOUS | Status: AC
  Administered 2022-06-20: 1000 mL via INTRAVENOUS

## 2022-06-20 MED ORDER — DICYCLOMINE HCL 20 MG PO TABS
20.0000 mg | ORAL_TABLET | Freq: Three times a day (TID) | ORAL | 0 refills | Status: DC | PRN
  Filled 2022-06-20: qty 15, 5d supply, fill #0

## 2022-06-20 MED ORDER — KETOROLAC TROMETHAMINE 30 MG/ML IJ SOLN
30.0000 mg | Freq: Once | INTRAMUSCULAR | Status: AC
  Administered 2022-06-20: 30 mg via INTRAVENOUS
  Filled 2022-06-20: qty 1

## 2022-06-20 MED ORDER — MORPHINE SULFATE (PF) 4 MG/ML IV SOLN
4.0000 mg | Freq: Once | INTRAVENOUS | Status: AC
  Administered 2022-06-20: 4 mg via INTRAVENOUS
  Filled 2022-06-20: qty 1

## 2022-06-20 MED ORDER — DICYCLOMINE HCL 10 MG PO CAPS
20.0000 mg | ORAL_CAPSULE | Freq: Once | ORAL | Status: AC
  Administered 2022-06-20: 20 mg via ORAL
  Filled 2022-06-20: qty 2

## 2022-06-20 NOTE — ED Notes (Signed)
Informed pt urine needed to complete workup. Urinal at bedside.

## 2022-06-20 NOTE — ED Notes (Signed)
Pt given a cup of ice water

## 2022-06-20 NOTE — ED Notes (Signed)
Called lab to add on hepatic function panel   

## 2022-06-20 NOTE — ED Provider Notes (Signed)
[facility] Provider Note    Event Date/Time   First MD Initiated Contact with Patient 06/20/22 [phone]     (approximate)   History   Abdominal Pain   HPI  Steven Khan is a 41 y.o. male with previous hernia repair who presents to the emergency department with generalized abdominal pain, nausea and vomiting that started today.  No diarrhea.  No fevers.   History provided by patient.    History reviewed. No pertinent past medical history.  Past Surgical History:  Procedure Laterality Date   HERNIA REPAIR      MEDICATIONS:  Prior to Admission medications   Not on File    Physical Exam   Triage Vital Signs: ED Triage Vitals [06/19/22 2345]  Enc Vitals Group     BP 136/81     Pulse Rate 88     Resp 16     Temp 98.4 F (36.9 C)     Temp Source Oral     SpO2 99 %     Weight 211 lb (95.7 kg)     Height 6' (1.829 m)     Head Circumference      Peak Flow      Pain Score 10     Pain Loc      Pain Edu?      Excl. in GC?     Most recent vital signs: Vitals:   06/20/22 0716 06/20/22 0717  BP:    Pulse: (!) 58 (!) 57  Resp:    Temp:    SpO2: 96% 97%    CONSTITUTIONAL: Alert and oriented and responds appropriately to questions.  Appears uncomfortable, lying on the floor of the room HEAD: Normocephalic, atraumatic EYES: Conjunctivae clear, pupils appear equal, sclera nonicteric ENT: normal nose; moist mucous membranes NECK: Supple, normal ROM CARD: RRR; S1 and S2 appreciated; no murmurs, no clicks, no rubs, no gallops RESP: Normal chest excursion without splinting or tachypnea; breath sounds clear and equal bilaterally; no wheezes, no rhonchi, no rales, no hypoxia or respiratory distress, speaking full sentences ABD/GI: Normal bowel sounds; non-distended; soft, tender to palpation diffusely without guarding or rebound BACK: The back appears normal EXT: Normal ROM in all joints; no deformity noted, no edema; no cyanosis SKIN:  Normal color for age and race; warm; no rash on exposed skin NEURO: Moves all extremities equally, normal speech PSYCH: The patient's mood and manner are appropriate.   ED Results / Procedures / Treatments   LABS: (all labs ordered are listed, but only abnormal results are displayed) Labs Reviewed  COMPREHENSIVE METABOLIC PANEL - Abnormal; Notable for the following components:      Result Value   Total Bilirubin 2.6 (*)    All other components within normal limits  LIPASE, BLOOD  CBC  URINALYSIS, ROUTINE W REFLEX MICROSCOPIC     EKG:  RADIOLOGY: My personal review and interpretation of imaging: CT abdomen pelvis shows no acute abnormality.  I have personally reviewed all radiology reports.   CT Abdomen Pelvis W Contrast  Result Date: 06/20/2022 CLINICAL DATA:  Nausea, vomiting EXAM: CT ABDOMEN AND PELVIS WITH CONTRAST TECHNIQUE: Multidetector CT imaging of the abdomen and pelvis was performed using the standard protocol following bolus administration of intravenous contrast. RADIATION DOSE REDUCTION: This exam was performed according to the departmental dose-optimization program which includes automated exposure control, adjustment of the mA and/or kV according to patient size and/or use of iterative reconstruction technique. CONTRAST:  OMNIPAQUE IOHEXOL 300  MG/ML  SOLN COMPARISON:  None Available. FINDINGS: Lower chest: No acute abnormality. Hepatobiliary: Mild focal fatty hepatic infiltration adjacent the falciform ligament. No enhancing intrahepatic mass. Tiny cyst noted within the right hepatic dome. No intra or extrahepatic biliary ductal dilation. Gallbladder unremarkable. Pancreas: Unremarkable Spleen: Unremarkable Adrenals/Urinary Tract: Adrenal glands are unremarkable. Kidneys are normal, without renal calculi, focal lesion, or hydronephrosis. Bladder is unremarkable. Stomach/Bowel: Stomach is within normal limits. Appendix appears normal. No evidence of bowel wall  thickening, distention, or inflammatory changes. Vascular/Lymphatic: No significant vascular findings are present. No enlarged abdominal or pelvic lymph nodes. Reproductive: Prostate is unremarkable. Other: No abdominal wall hernia or abnormality. No abdominopelvic ascites. Musculoskeletal: No acute or significant osseous findings. IMPRESSION: No acute intra-abdominal pathology identified. No radiographic explanation for the patient's reported symptoms. Electronically Signed   By: Helyn Numbers M.D.   On: 06/20/2022 04:14     PROCEDURES:  Critical Care performed: No     Procedures    IMPRESSION / MDM / ASSESSMENT AND PLAN / ED COURSE  I reviewed the triage vital signs and the nursing notes.    Patient here for abdominal pain, nausea and vomiting.  The patient is on the cardiac monitor to evaluate for evidence of arrhythmia and/or significant heart rate changes.   DIFFERENTIAL DIAGNOSIS (includes but not limited to):   GERD, gastritis, colitis, diverticulitis, bowel obstruction, appendicitis, pancreatitis, cholecystitis, kidney stone, UTI, pyelonephritis, viral gastroenteritis.   Patient's presentation is most consistent with acute presentation with potential threat to life or bodily function.   PLAN: CBC, CMP, lipase is obtained from triage.  No leukocytosis.  Patient has normal LFTs but his total bilirubin is elevated.  Lipase is normal.  CT of the abdomen pelvis shows no acute abnormality.  Normal-appearing appendix.  He is quite tender to palpation diffusely.  He may have a viral gastroenteritis but given the elevated total bilirubin, I am going to obtain a right upper quadrant ultrasound.  Urine also pending.   MEDICATIONS GIVEN IN ED: Medications  ondansetron (ZOFRAN-ODT) disintegrating tablet 4 mg (4 mg Oral Given 06/19/22 2348)  iohexol (OMNIPAQUE) 300 MG/ML solution 100 mL (100 mLs Intravenous Contrast Given 06/20/22 0342)  sodium chloride 0.9 % bolus 1,000 mL ( Intravenous  Infusion Verify 06/20/22 0744)  ondansetron (ZOFRAN) injection 4 mg (4 mg Intravenous Given 06/20/22 0644)  ketorolac (TORADOL) 30 MG/ML injection 30 mg (30 mg Intravenous Given 06/20/22 0645)  morphine (PF) 4 MG/ML injection 4 mg (4 mg Intravenous Given 06/20/22 0645)  dicyclomine (BENTYL) capsule 20 mg (20 mg Oral Given 06/20/22 0645)     ED COURSE: Patient has received his medications.  Starting to feel somewhat better.  Ultrasound and urine pending.  Signed out the oncoming ED physician.   CONSULTS: Dispo per further work-up.   OUTSIDE RECORDS REVIEWED: No previous records for review.       FINAL CLINICAL IMPRESSION(S) / ED DIAGNOSES   Final diagnoses:  Generalized abdominal pain     Rx / DC Orders   ED Discharge Orders          Ordered    dicyclomine (BENTYL) 20 MG tablet  Every 8 hours PRN        06/20/22 0753    ondansetron (ZOFRAN-ODT) 4 MG disintegrating tablet  Every 6 hours PRN        06/20/22 0753             Note:  This document was prepared using Dragon voice recognition software and may  include unintentional dictation errors.   Lorina Duffner, Layla Maw, DO 06/20/22 [phone]

## 2022-06-20 NOTE — ED Notes (Signed)
Pt now squatting on floor next to bed (first encounter with pt when informed need urine, pt was sleeping in bed). Pt asking which pain meds will be discharged with. EDP informed.

## 2022-06-20 NOTE — Discharge Instructions (Addendum)
You may alternate Tylenol 1000 mg every 6 hours as needed for pain, fever and Ibuprofen 800 mg every 6-8 hours as needed for pain, fever.  Please take Ibuprofen with food.  Do not take more than 4000 mg of Tylenol (acetaminophen) in a 24 hour period.  You should also take the Bentyl as needed for crampy abdominal pain.  The ondansetron (Zofran) is for nausea.  The famotidine is to reduce stomach acid and the burning in the back of your throat.

## 2022-06-20 NOTE — ED Notes (Signed)
Pt asleep.

## 2022-06-20 NOTE — ED Provider Notes (Signed)
-----------------------------------------   10:49 AM on 06/20/2022 -----------------------------------------  I took over care on this patient from Dr. Elesa Massed.  Urinalysis is negative except for ketones consistent with vomiting.  UDS is positive for cannabinoids (as well as opioids, however this is likely from the medication he was given in the ED).  Hepatic function panel shows that the patient's hyperbilirubinemia is indirect bilirubin, consistent with [place] Lone disease or other benign etiology.  There is no evidence of biliary obstruction.  Right upper quadrant ultrasound is negative.  On reassessment, the patient appears comfortable.  He has had relief of his pain for several hours.  He has been tolerating p.o. with no further vomiting.  I counseled him on the results of the work-up.  He is stable for discharge home.  Return precautions provided, and he expresses understanding.  Zofran and Bentyl were prescribed by Dr. Elesa Massed, and I have added on Pepcid as the patient is complaining of some burning in the back of his throat consistent with reflux.   Dionne Bucy, MD 06/20/22 1051

## 2022-06-22 ENCOUNTER — Other Ambulatory Visit: Payer: Self-pay
